# Patient Record
Sex: Male | Born: 1975 | Race: White | Hispanic: No | Marital: Married | Smoking: Never smoker
Health system: Southern US, Community
[De-identification: ages and names within clinical notes are randomized; demographics above are authoritative.]

## PROBLEM LIST (undated history)

## (undated) DIAGNOSIS — K219 Gastro-esophageal reflux disease without esophagitis: Secondary | ICD-10-CM

## (undated) DIAGNOSIS — K222 Esophageal obstruction: Secondary | ICD-10-CM

## (undated) DIAGNOSIS — E7800 Pure hypercholesterolemia, unspecified: Secondary | ICD-10-CM

## (undated) DIAGNOSIS — K449 Diaphragmatic hernia without obstruction or gangrene: Secondary | ICD-10-CM

## (undated) HISTORY — DX: Gastro-esophageal reflux disease without esophagitis: K21.9

## (undated) HISTORY — DX: Diaphragmatic hernia without obstruction or gangrene: K44.9

## (undated) HISTORY — PX: WISDOM TOOTH EXTRACTION: SHX21

## (undated) HISTORY — PX: VASECTOMY: SHX75

## (undated) HISTORY — PX: UPPER GASTROINTESTINAL ENDOSCOPY: SHX188

## (undated) HISTORY — PX: TONSILLECTOMY: SUR1361

## (undated) HISTORY — DX: Esophageal obstruction: K22.2

---

## 2001-01-07 ENCOUNTER — Emergency Department (HOSPITAL_COMMUNITY): Admission: EM | Admit: 2001-01-07 | Discharge: 2001-01-07 | Payer: Self-pay | Admitting: *Deleted

## 2001-01-07 ENCOUNTER — Encounter: Payer: Self-pay | Admitting: *Deleted

## 2001-01-13 ENCOUNTER — Emergency Department (HOSPITAL_COMMUNITY): Admission: EM | Admit: 2001-01-13 | Discharge: 2001-01-13 | Payer: Self-pay | Admitting: Emergency Medicine

## 2005-06-29 ENCOUNTER — Ambulatory Visit: Payer: Self-pay | Admitting: Internal Medicine

## 2005-06-30 ENCOUNTER — Ambulatory Visit: Payer: Self-pay | Admitting: Internal Medicine

## 2005-07-28 ENCOUNTER — Ambulatory Visit: Payer: Self-pay | Admitting: Internal Medicine

## 2006-03-24 ENCOUNTER — Ambulatory Visit: Payer: Self-pay | Admitting: Internal Medicine

## 2006-03-24 LAB — CONVERTED CEMR LAB
ALT: 40 units/L (ref 0–40)
AST: 27 units/L (ref 0–37)
Total CK: 114 units/L (ref 7–195)

## 2006-04-08 ENCOUNTER — Ambulatory Visit: Payer: Self-pay | Admitting: Internal Medicine

## 2006-04-08 LAB — CONVERTED CEMR LAB
Chol/HDL Ratio, serum: 7.7
Cholesterol: 282 mg/dL (ref 0–200)
HDL: 36.6 mg/dL — ABNORMAL LOW (ref >39.0)
LDL DIRECT: 206.6 mg/dL
Triglyceride fasting, serum: 169 mg/dL — ABNORMAL HIGH (ref 0–149)
VLDL: 34 mg/dL (ref 0–40)

## 2006-04-20 ENCOUNTER — Ambulatory Visit: Payer: Self-pay | Admitting: Internal Medicine

## 2009-02-06 ENCOUNTER — Ambulatory Visit: Payer: Self-pay | Admitting: Internal Medicine

## 2009-08-13 ENCOUNTER — Encounter: Payer: Self-pay | Admitting: Internal Medicine

## 2009-11-06 ENCOUNTER — Telehealth: Payer: Self-pay | Admitting: Gastroenterology

## 2009-11-12 ENCOUNTER — Encounter (INDEPENDENT_AMBULATORY_CARE_PROVIDER_SITE_OTHER): Payer: Self-pay | Admitting: *Deleted

## 2009-11-12 ENCOUNTER — Ambulatory Visit: Payer: Self-pay | Admitting: Gastroenterology

## 2009-11-12 DIAGNOSIS — K219 Gastro-esophageal reflux disease without esophagitis: Secondary | ICD-10-CM

## 2009-11-14 LAB — CONVERTED CEMR LAB
ALT: 26 units/L (ref 0–53)
AST: 22 units/L (ref 0–37)
Albumin: 4.4 g/dL (ref 3.5–5.2)
Alkaline Phosphatase: 43 units/L (ref 39–117)
BUN: 11 mg/dL (ref 6–23)
Basophils Absolute: 0 10*3/uL (ref 0.0–0.1)
Basophils Relative: 0.4 % (ref 0.0–3.0)
Bilirubin, Direct: 0.2 mg/dL (ref 0.0–0.3)
CO2: 27 meq/L (ref 19–32)
Calcium: 9.3 mg/dL (ref 8.4–10.5)
Chloride: 104 meq/L (ref 96–112)
Cholesterol: 254 mg/dL — ABNORMAL HIGH (ref 0–200)
Creatinine, Ser: 0.7 mg/dL (ref 0.4–1.5)
Direct LDL: 170.6 mg/dL
Eosinophils Absolute: 0.2 10*3/uL (ref 0.0–0.7)
Eosinophils Relative: 3.5 % (ref 0.0–5.0)
Ferritin: 162.8 ng/mL (ref 22.0–322.0)
Folate: 9.9 ng/mL
GFR calc non Af Amer: 137.13 mL/min (ref >60)
Glucose, Bld: 94 mg/dL (ref 70–99)
HCT: 43.1 % (ref 39.0–52.0)
HDL: 46 mg/dL (ref >39.00)
Hemoglobin: 14.9 g/dL (ref 13.0–17.0)
Iron: 153 ug/dL (ref 42–165)
Lymphocytes Relative: 36.4 % (ref 12.0–46.0)
Lymphs Abs: 2.4 10*3/uL (ref 0.7–4.0)
MCHC: 34.6 g/dL (ref 30.0–36.0)
MCV: 91.3 fL (ref 78.0–100.0)
Monocytes Absolute: 0.5 10*3/uL (ref 0.1–1.0)
Monocytes Relative: 7.9 % (ref 3.0–12.0)
Neutro Abs: 3.4 10*3/uL (ref 1.4–7.7)
Neutrophils Relative %: 51.8 % (ref 43.0–77.0)
Platelets: 235 10*3/uL (ref 150.0–400.0)
Potassium: 4.4 meq/L (ref 3.5–5.1)
RBC: 4.72 M/uL (ref 4.22–5.81)
RDW: 12.8 % (ref 11.5–14.6)
Saturation Ratios: 44.3 % (ref 20.0–50.0)
Sodium: 137 meq/L (ref 135–145)
TSH: 1.05 microintl units/mL (ref 0.35–5.50)
Total Bilirubin: 0.6 mg/dL (ref 0.3–1.2)
Total CHOL/HDL Ratio: 6
Total Protein: 7.3 g/dL (ref 6.0–8.3)
Transferrin: 246.6 mg/dL (ref 212.0–360.0)
Triglycerides: 191 mg/dL — ABNORMAL HIGH (ref 0.0–149.0)
VLDL: 38.2 mg/dL (ref 0.0–40.0)
Vitamin B-12: 339 pg/mL (ref 211–911)
WBC: 6.6 10*3/uL (ref 4.5–10.5)

## 2009-11-20 ENCOUNTER — Ambulatory Visit: Payer: Self-pay | Admitting: Gastroenterology

## 2009-11-20 LAB — CONVERTED CEMR LAB: UREASE: NEGATIVE

## 2009-11-21 ENCOUNTER — Telehealth: Payer: Self-pay | Admitting: Gastroenterology

## 2009-11-22 ENCOUNTER — Encounter: Payer: Self-pay | Admitting: Gastroenterology

## 2009-12-10 ENCOUNTER — Telehealth: Payer: Self-pay | Admitting: Gastroenterology

## 2009-12-24 ENCOUNTER — Ambulatory Visit: Payer: Self-pay | Admitting: Gastroenterology

## 2010-02-18 ENCOUNTER — Telehealth: Payer: Self-pay | Admitting: Gastroenterology

## 2010-04-09 ENCOUNTER — Ambulatory Visit: Payer: Self-pay | Admitting: Internal Medicine

## 2010-04-09 DIAGNOSIS — J019 Acute sinusitis, unspecified: Secondary | ICD-10-CM

## 2010-07-15 NOTE — Progress Notes (Signed)
Summary: triage  Phone Note Call from Patient Call back at Home Phone [phone]   Caller: Patient Call For: Dr. Jarold Motto Reason for Call: Talk to Nurse Summary of Call: offered next available- June 28th... pt states he is a personal friend of Dr. Jarold Motto and that he spoke with Dr. Jarold Motto last night about his GI concerns... pt states that Dr. Jarold Motto told him to call our office and ask for Lupita Leash so that he could be worked in asap... pt was told by dentist to see a GI because he had acid coming up into his mouth and eroding his tooth enamel...  Initial call taken by: Vallarie Mare,  Nov 06, 2009 10:24 AM  Follow-up for Phone Call        LM for pt to call.    Ashok Cordia RN  Nov 06, 2009 11:10 AM   Appt scheduled.  Pt notified of no show fee. Follow-up by: Ashok Cordia RN,  Nov 06, 2009 11:31 AM

## 2010-07-15 NOTE — Assessment & Plan Note (Signed)
Summary: Post Proc 4-6 weeks.dfs   History of Present Illness Visit Type: Follow-up Visit Primary GI MD: Sheryn Bison MD FACP FAGA Primary Provider: Marga Melnick, MD Requesting Provider: na Chief Complaint: F/u from endo. Pt states that he is great and denies any GI complaints History of Present Illness:   35 year old Caucasian male in recent had endoscopy with esophageal dilatation. He had severe esophagitis and a peptic stricture that was dilated. Surprisingly, he had lower GI symptomatology, and his dentist had noticed severe dental erosions. He currently is on daily PPI therapy and is asymptomatic. Lab data was checked and was normal except for a mildly elevated serum cholesterol level.   GI Review of Systems      Denies abdominal pain, acid reflux, belching, bloating, chest pain, dysphagia with liquids, dysphagia with solids, heartburn, loss of appetite, nausea, vomiting, vomiting blood, weight loss, and  weight gain.        Denies anal fissure, black tarry stools, change in bowel habit, constipation, diarrhea, diverticulosis, fecal incontinence, heme positive stool, hemorrhoids, irritable bowel syndrome, jaundice, light color stool, liver problems, rectal bleeding, and  rectal pain.    Current Medications (verified): 1)  Aspirin 81 Mg Tabs (Aspirin) .... Take 1 Tablet By Mouth Once A Day 2)  Fish Oil  Oil (Fish Oil) .... Take 1 Capsule By Mouth Once A Day 3)  Aciphex 20 Mg Tbec (Rabeprazole Sodium) .... One Tablet By Mouth Once Daily  Allergies (verified): No Known Drug Allergies  Past History:  Past medical, surgical, family and social histories (including risk factors) reviewed for relevance to current acute and chronic problems.  Past Medical History: Reviewed history from 11/12/2009 and no changes required. Hyperlipidemia  Past Surgical History: Reviewed history from 11/12/2009 and no changes required. Tonsillectomy  Family History: Reviewed history from  11/12/2009 and no changes required. No FH of Colon Cancer:  Social History: Reviewed history from 11/12/2009 and no changes required. Married, 1 boy, 1 girl Self Employed Patient has never smoked.  Alcohol Use - yes 3 drinks/day Illicit Drug Use - no Patient gets regular exercise. 2 x week  Review of Systems  The patient denies allergy/sinus, anemia, anxiety-new, arthritis/joint pain, back pain, blood in urine, breast changes/lumps, change in vision, confusion, cough, coughing up blood, depression-new, fainting, fatigue, fever, headaches-new, hearing problems, heart murmur, heart rhythm changes, itching, menstrual pain, muscle pains/cramps, night sweats, nosebleeds, pregnancy symptoms, shortness of breath, skin rash, sleeping problems, sore throat, swelling of feet/legs, swollen lymph glands, thirst - excessive , urination - excessive , urination changes/pain, urine leakage, vision changes, and voice change.    Vital Signs:  Patient profile:   35 year old male Height:      73 inches Weight:      230 pounds BMI:     30.45 BSA:     2.29 Pulse rate:   64 / minute Pulse rhythm:   regular BP sitting:   122 / 76  (left arm) Cuff size:   regular  Vitals Entered By: Ok Anis CMA (December 24, 2009 9:50 AM)  Physical Exam  General:  Well developed, well nourished, no acute distress.healthy appearing.   Head:  Normocephalic and atraumatic. Eyes:  PERRLA, no icterus.exam deferred to patient's ophthalmologist.   Psych:  Alert and cooperative. Normal mood and affect.   Impression & Recommendations:  Problem # 1:  ESOPHAGEAL REFLUX (ICD-530.81) Assessment Improved Chronic reflux or G. reviewed and I have advised daily PPI therapy with GI followup in 6 months time.  Biopsies of the esophagus did not show evidence of Barrett's mucosa. He did have a 5 cm hiatal hernia.  Problem # 2:  SCREENING FOR LIPOID DISORDERS (ICD-V77.91) Assessment: Unchanged Gradual weight loss reduction plan  recommended per his obesity. He may need statin therapy per Dr. Alwyn Ren.  Patient Instructions: 1)  Increase Omeprazole to 40 mg once daily. 2)  Please schedule a follow-up appointment in 6 months. 3)  The medication list was reviewed and reconciled.  All changed / newly prescribed medications were explained.  A complete medication list was provided to the patient / caregiver. 4)  Copy sent to : Dr. Marga Melnick and Dr. Johny Sax, DDS. 5)  Please continue current medications.  6)  Avoid foods high in acid content ( tomatoes, citrus juices, spicy foods) . Avoid eating within 3 to 4 hours of lying down or before exercising. Do not over eat; try smaller more frequent meals. Elevate head of bed four inches when sleeping.

## 2010-07-15 NOTE — Miscellaneous (Signed)
Summary: gi med  Clinical Lists Changes  Medications: Added new medication of ACIPHEX 20 MG  TBEC (RABEPRAZOLE SODIUM) Take 1 each am 30 minutes before breakfast - Signed Rx of ACIPHEX 20 MG  TBEC (RABEPRAZOLE SODIUM) Take 1 each am 30 minutes before breakfast;  #30 x 11;  Signed;  Entered by: Eual Fines RN;  Authorized by: Mardella Layman MD [facility];  Method used: Electronically to [id number]*, [address] [place], [place], [place]  [address], Ph: [phone], Fax: [phone] Observations: Added new observation of NKA: T (11/20/2009 14:16)    Prescriptions: ACIPHEX 20 MG  TBEC (RABEPRAZOLE SODIUM) Take 1 each am 30 minutes before breakfast  #30 x 11   Entered by:   Eual Fines RN   Authorized by:   Mardella Layman MD [facility]   Signed by:   Eual Fines RN on 11/20/2009   Method used:   Electronically to        [facility] [phone]* (retail)       [address]       [place], [place]  [address]       Ph: [phone]       Fax: [phone]   RxID:   [id number]

## 2010-07-15 NOTE — Miscellaneous (Signed)
Summary: clotest  Clinical Lists Changes  Orders: Added new Test order of TLB-H Pylori Screen Gastric Biopsy (83013-CLOTEST) - Signed 

## 2010-07-15 NOTE — Assessment & Plan Note (Signed)
Summary: cough and cold, no fever///sph   Vital Signs:  Patient profile:   35 year old male Height:      73 inches (185.42 cm) Weight:      232 pounds (105.45 kg) BMI:     30.72 O2 Sat:      96 % on Room air Temp:     98.6 degrees F (37.00 degrees C) oral Pulse rate:   82 / minute Resp:     14 per minute BP sitting:   120 / 82  (left arm) Cuff size:   regular  Vitals Entered By: Lucious Groves CMA (April 09, 2010 12:08 PM)  O2 Flow:  Room air CC: Cold and cough x1 week/Possible sinus infection./kb, URI symptoms Is Patient Diabetic? No Pain Assessment Patient in pain? no      Comments Patient notes that he has been having fever, yellow mucous production, congestion, and sore throat. Patient denies SOB./kb   Primary Care Provider:  Marga Melnick, MD  CC:  Cold and cough x1 week/Possible sinus infection./kb and URI symptoms.  History of Present Illness: RTI  Symptoms      This is a 35 year old man who presents with RTI  symptoms x 1 week, onset as ST & rhinitis.  The patient reports nasal congestion & PNDrainage, purulent nasal discharge, sore throat, and productive cough with yelow sputum, but denies earache.  Associated symptoms include low-grade fever (<100.5 degrees) and wheezing with deep breathing. No PMH of asthma The patient denies dyspnea.  The patient also reports headache with the cough and  marked  fatigue.  The patient denies itchy watery eyes and sneezing.  The patient denies the following risk factors for Strep sinusitis: unilateral facial pain and tooth pain.  Rx: Mucinex , Advil  Current Medications (verified): 1)  Aspirin 81 Mg Tabs (Aspirin) .... Take 1 Tablet By Mouth Once A Day 2)  Fish Oil  Oil (Fish Oil) .... Take 1 Capsule By Mouth Once A Day 3)  Omeprazole 40 Mg  Cpdr (Omeprazole) .... Take One By Mouth Two Times A Day  Allergies (verified): No Known Drug Allergies  Physical Exam  General:  well-nourished,in no acute distress; alert,appropriate and  cooperative throughout examination Eyes:  No corneal or conjunctival inflammation noted.  Ears:  External ear exam shows no significant lesions or deformities.  Otoscopic examination reveals  some wax bilaterally. Nose:  External nasal examination shows no deformity or inflammation. Nasal mucosa are pink and moist without lesions or exudates. Mouth:  Oral mucosa and oropharynx without lesions or exudates.  Teeth in good repair. Mild uvular  erythema Lungs:  Normal respiratory effort, chest expands symmetrically. Lungs are clear to auscultation except minor isolated inspiratory pop Heart:  Normal rate and regular rhythm. S1 and S2 normal without gallop, murmur, click, rub or other extra sounds. Cervical Nodes:  No lymphadenopathy noted Axillary Nodes:  No palpable lymphadenopathy   Impression & Recommendations:  Problem # 1:  SINUSITIS- ACUTE-NOS (ICD-461.9)  His updated medication list for this problem includes:    Amoxicillin-pot Clavulanate 875-125 Mg Tabs (Amoxicillin-pot clavulanate) .Marland Kitchen... 1 every 12 hrs with a meal    Hydromet 5-1.5 Mg/29ml Syrp (Hydrocodone-homatropine) .Marland Kitchen... 1 tsp everyy 6 hrs as needed  Problem # 2:  BRONCHITIS-ACUTE (ICD-466.0)  His updated medication list for this problem includes:    Amoxicillin-pot Clavulanate 875-125 Mg Tabs (Amoxicillin-pot clavulanate) .Marland Kitchen... 1 every 12 hrs with a meal    Advair Diskus 250-50 Mcg/dose Aepb (Fluticasone-salmeterol) .Marland KitchenMarland KitchenMarland KitchenMarland Kitchen 1  inhalation every 12 hrs; gargle & spit after use    Hydromet 5-1.5 Mg/27ml Syrp (Hydrocodone-homatropine) .Marland Kitchen... 1 tsp everyy 6 hrs as needed  Complete Medication List: 1)  Aspirin 81 Mg Tabs (Aspirin) .... Take 1 tablet by mouth once a day 2)  Fish Oil Oil (Fish oil) .... Take 1 capsule by mouth once a day 3)  Omeprazole 40 Mg Cpdr (Omeprazole) .... Take one by mouth two times a day 4)  Amoxicillin-pot Clavulanate 875-125 Mg Tabs (Amoxicillin-pot clavulanate) .Marland Kitchen.. 1 every 12 hrs with a meal 5)  Advair  Diskus 250-50 Mcg/dose Aepb (Fluticasone-salmeterol) .Marland Kitchen.. 1 inhalation every 12 hrs; gargle & spit after use 6)  Hydromet 5-1.5 Mg/11ml Syrp (Hydrocodone-homatropine) .Marland Kitchen.. 1 tsp everyy 6 hrs as needed  Patient Instructions: 1)  Neti pot once daily until sinuses are clear. 2)  Drink as much NON  dairy fluid as you can tolerate for the next few days. Prescriptions: HYDROMET 5-1.5 MG/5ML SYRP (HYDROCODONE-HOMATROPINE) 1 tsp everyy 6 hrs as needed  #120cc x 0   Entered and Authorized by:   Marga Melnick MD   Signed by:   Marga Melnick MD on 04/09/2010   Method used:   Print then Give to Patient   RxID:   [id number] ADVAIR DISKUS 250-50 MCG/DOSE AEPB (FLUTICASONE-SALMETEROL) 1 inhalation every 12 hrs; gargle & spit after use  #1 x 0   Entered and Authorized by:   Marga Melnick MD   Signed by:   Marga Melnick MD on 04/09/2010   Method used:   Samples Given   RxID:   [id number] AMOXICILLIN-POT CLAVULANATE 875-125 MG TABS (AMOXICILLIN-POT CLAVULANATE) 1 every 12 hrs with a meal  #20 x 0   Entered and Authorized by:   Marga Melnick MD   Signed by:   Marga Melnick MD on 04/09/2010   Method used:   Faxed to ...       [facility] [phone]* (retail)       [address]       [place], [place]  [address]       Ph: [phone]       Fax: [phone]   RxID:   [id number]    Orders Added: 1)  Est. Patient Level III [[address]]

## 2010-07-15 NOTE — Progress Notes (Signed)
Summary: ? re meds  Phone Note Call from Patient Call back at Home Phone [phone]   Caller: Patient Call For: Jarold Motto Reason for Call: Talk to Nurse Summary of Call: Patient wants to speak to nurse regarding his meds Initial call taken by: Tawni Levy,  December 10, 2009 9:10 AM  Follow-up for Phone Call        LM for pt to call.  Ashok Cordia RN  December 10, 2009 9:31 AM  Spoke with pt.  Nexium is going to cost over $300.  Aciphex had been denied and we changed to nexium as suggested by insurance.  Pt asks if we can check and see why this is so expensive if it is a perferred medication. Follow-up by: Ashok Cordia RN,  December 10, 2009 2:10 PM  Additional Follow-up for Phone Call Additional follow up Details #1::        Tresa Endo, can you help me by checking on this/  thanks Additional Follow-up by: Ashok Cordia RN,  December 11, 2009 11:12 AM    Additional Follow-up for Phone Call Additional follow up Details #2::    Per note on 11-21-2009 patient per [facility] can try Nexium or Generic PPI. If Nexium is too much for patient then he can try a Generic PPI.. Follow-up by: Ok Anis CMA,  December 13, 2009 1:48 PM  Additional Follow-up for Phone Call Additional follow up Details #3:: Details for Additional Follow-up Action Taken: LM for pt to call.  Lupita Leash Surface RN  December 19, 2009 10:59 AM  Talked with pt.  Pt wants to try generic med.  Will change to omeprazole.  Pt wasnt rx sent to [facility]. Additional Follow-up by: Ashok Cordia RN,  December 19, 2009 11:44 AM  New/Updated Medications: OMEPRAZOLE 20 MG CPDR (OMEPRAZOLE) 1 by mouth once daily Prescriptions: OMEPRAZOLE 20 MG CPDR (OMEPRAZOLE) 1 by mouth once daily  #90 x 3   Entered by:   Ashok Cordia RN   Authorized by:   Mardella Layman MD [facility]   Signed by:   Ashok Cordia RN on 12/19/2009   Method used:   Faxed to ...       [facility] [place] (mail-order)             , [place]         Ph: [phone]       Fax: [phone]   RxID:    [id number]

## 2010-07-15 NOTE — Progress Notes (Signed)
Summary: refill  Phone Note Call from Patient Call back at Home Phone [phone]   Caller: Patient Call For: Dr Jarold Motto Reason for Call: Refill Medication, Talk to Nurse Summary of Call: Patient needs refills for his Omeprazole called in to [facility] on [address]. Please call patient when done. (patient states that Dr Jarold Motto told him to take it twice a day) Initial call taken by: Tawni Levy,  February 18, 2010 12:09 PM  Follow-up for Phone Call        rx sent, patient aware Follow-up by: Harlow Mares CMA Duncan Dull),  February 18, 2010 12:14 PM    New/Updated Medications: OMEPRAZOLE 40 MG  CPDR (OMEPRAZOLE) take one by mouth two times a day Prescriptions: OMEPRAZOLE 40 MG  CPDR (OMEPRAZOLE) take one by mouth two times a day  #60 x 11   Entered by:   Harlow Mares CMA (AAMA)   Authorized by:   Mardella Layman MD [facility]   Signed by:   Harlow Mares CMA (AAMA) on 02/18/2010   Method used:   Electronically to        [facility] [phone]* (retail)       [address]       [place], [place]  [address]       Ph: [phone]       Fax: [phone]   RxID:   [id number]   Appended Document: refill patient wants one refill to [facility] and the others rxs to [facility].   Clinical Lists Changes  Medications: Removed medication of ACIPHEX 20 MG TBEC (RABEPRAZOLE SODIUM) one tablet by mouth once daily Rx of OMEPRAZOLE 40 MG  CPDR (OMEPRAZOLE) take one by mouth two times a day;  #180 x 3;  Signed;  Entered by: Harlow Mares CMA (AAMA);  Authorized by: Mardella Layman MD [facility];  Method used: Faxed to [facility] [place], , , [place]  , Ph: [phone], Fax: [phone]    Prescriptions: OMEPRAZOLE 40 MG  CPDR (OMEPRAZOLE) take one by mouth two times a day  #180 x 3   Entered by:   Harlow Mares CMA (AAMA)   Authorized by:   Mardella Layman MD [facility]   Signed by:   Harlow Mares CMA (AAMA) on 02/18/2010   Method used:   Faxed to ...       [facility]  [place] (mail-order)             , [place]         Ph: [phone]       Fax: [phone]   RxID:   [id number]

## 2010-07-15 NOTE — Letter (Signed)
Summary: Patient Notice-Endo Biopsy Results  [facility]  [address] [place], [place] [address]   Phone: [phone]  Fax: [phone]        November 22, 2009 MRN: [id number]    John Whitney [address] [place], [place]  [address]    Dear [name],  I am pleased to inform you that the biopsies taken during your recent endoscopic examination did not show any evidence of cancer upon pathologic examination.  Additional information/recommendations:  __No further action is needed at this time.  Please follow-up with      your primary care physician for your other healthcare needs.  __ Please call [phone] to schedule a return visit to review      your condition.  _XX_ Continue with the treatment plan as outlined on the day of your      exam.  __ You should have a repeat endoscopic examination for this problem              in _ months/years.   Please call us if you are having persistent problems or have questions about your condition that have not been fully answered at this time.  Sincerely,  Mardella Layman MD [facility]  This letter has been electronically signed by your physician.  Appended Document: Patient Notice-Endo Biopsy Results letter mailed.

## 2010-07-15 NOTE — Consult Note (Signed)
Summary: [facility]  [facility]   Imported By: Lanelle Bal 08/23/2009 08:46:45  _____________________________________________________________________  External Attachment:    Type:   Image     Comment:   External Document

## 2010-07-15 NOTE — Procedures (Signed)
Summary: Upper Endoscopy  Patient: Jamario Colina Note: All result statuses are Final unless otherwise noted.  Tests: (1) Upper Endoscopy (EGD)   EGD Upper Endoscopy       DONE     [facility]     [address]     [place], [place]  [address]           ENDOSCOPY PROCEDURE REPORT           PATIENT:  John Whitney, John Whitney  MR#:  [id number]     BIRTHDATE:  1975/07/29, 34 yrs. old  GENDER:  male           ENDOSCOPIST:  Vania Rea. Jarold Motto, MD, [facility]     Referred by:           PROCEDURE DATE:  11/20/2009     PROCEDURE:  EGD with biopsy     ASA CLASS:  Class I     INDICATIONS:  RECURRENT DENTAL EROSIONS AND OCCASIONAL GERD     SYMPTOMS,,,           MEDICATIONS:   Fentanyl 50 mcg IV, Versed 8 mg IV     TOPICAL ANESTHETIC:  Exactacain Spray           DESCRIPTION OF PROCEDURE:   After the risks benefits and     alternatives of the procedure were thoroughly explained, informed     consent was obtained.  The LB GIF-H180 [id number] endoscope was     introduced through the mouth and advanced to the second portion of     the duodenum, without limitations.  The instrument was slowly     withdrawn as the mucosa was fully examined.     <<PROCEDUREIMAGES>>           A stricture was found. CIRCUMFERENTIAL DISTAL STRICTURE, AND     SUPERFICIAL EROSIONS,AND 5 CM. HIATIAL HERNIA NOTED,,,SEE     PICTURES. BIOPSIES DONE OF STRICTURE AND DISTAL ESOPHAGUS.  The     stomach was entered and closely examined. The antrum, angularis,     and lesser curvature were well visualized, including a retroflexed     view of the cardia and fundus. The stomach wall was normally     distensable. The scope passed easily through the pylorus into the     duodenum. CLO BX. DONE.  A hiatal hernia was found. PROMINANT 5 CM     HH NOTED AND FREE REFLUX.  The duodenal bulb was normal in     appearance, as was the postbulbar duodenum.    Retroflexed views     revealed a hiatal hernia.    The scope was then withdrawn from the    patient and the procedure completed.           COMPLICATIONS:  None           ENDOSCOPIC IMPRESSION:     1) Stricture     2) Normal stomach     3) Hiatal hernia     4) Normal duodenum     5) A hiatal hernia     CHRONIC GERD AND DISTAL STRICTURE.R/O BARRETT'S MUCOSA.     RECOMMENDATIONS:     1) Anti-reflux regimen to be follow     2) Await biopsy results     DAILY PPI.ACIPHEX 20 MG QAM.#30.REFILL X 11.           REPEAT EXAM:  No           ______________________________  Vania Rea. Jarold Motto, MD, [facility]           CC:  Pecola Lawless, MD           n.     Rosalie DoctorMarland Kitchen   Vania Rea. Patterson at 11/20/2009 02:01 PM           Kelli Hope, [phone]  Note: An exclamation mark (!) indicates a result that was not dispersed into the flowsheet. Document Creation Date: 11/20/2009 2:02 PM _______________________________________________________________________  (1) Order result status: Final Collection or observation date-time: 11/20/2009 13:50 Requested date-time:  Receipt date-time:  Reported date-time:  Referring Physician:   Ordering Physician: Sheryn Bison [phone]) Specimen Source:  Source: Launa Grill Order Number: [id number] Lab site:

## 2010-07-15 NOTE — Assessment & Plan Note (Signed)
Summary: ? Gerd/ dfs   History of Present Illness Visit Type: new patient Primary GI MD: Sheryn Bison MD FACP FAGA Primary Provider: Marga Melnick, MD Chief Complaint: Reflux History of Present Illness:   35 year old Caucasian male self-referred after seeing his dentist and being diagnosed with multiple periodontal erosions from possible GERD.  John Whitney has been in excellent health all of his life without specific medical or surgical problems except for mild hyperlipidemia. He is not on antilipid drug therapy at this time, but does take aspirin 81 mg a day and daily fish oil. He Reports occasional nocturnal acid reflux after dietary indiscretions. He has had no typical burning substernal chest pain or regurgitation, coughing, hoarseness, asthma, globus sensation, or dysphagia.He Has not had previous evaluation or endoscopic exams or barium studies. His appetite is good and his weight is stable. He denies abuse of cigarettes or NSAIDs but uses alcohol socially. He gives no history of previous hepatitis or pancreatitis. He has regular bowel movements without melena or hematochezia. Family history is noncontributory.   GI Review of Systems    Reports acid reflux.      Denies abdominal pain, belching, bloating, chest pain, dysphagia with liquids, dysphagia with solids, heartburn, loss of appetite, nausea, vomiting, vomiting blood, weight loss, and  weight gain.        Denies anal fissure, black tarry stools, change in bowel habit, constipation, diarrhea, diverticulosis, fecal incontinence, heme positive stool, hemorrhoids, irritable bowel syndrome, jaundice, light color stool, liver problems, rectal bleeding, and  rectal pain. Preventive Screening-Counseling & Management  Alcohol-Tobacco     Smoking Status: never  Caffeine-Diet-Exercise     Does Patient Exercise: yes      Drug Use:  no.      Current Medications (verified): 1)  Aspirin 81 Mg Tabs (Aspirin) .... Take 1 Tablet By Mouth  Once A Day 2)  Fish Oil  Oil (Fish Oil) .... Take 1 Capsule By Mouth Once A Day  Allergies (verified): No Known Drug Allergies  Past History:  Past medical, surgical, family and social histories (including risk factors) reviewed for relevance to current acute and chronic problems.  Past Medical History: Hyperlipidemia  Past Surgical History: Tonsillectomy  Family History: Reviewed history and no changes required. No FH of Colon Cancer:  Social History: Reviewed history and no changes required. Married, 1 boy, 1 girl Self Employed Patient has never smoked.  Alcohol Use - yes 3 drinks/day Illicit Drug Use - no Patient gets regular exercise. 2 x week Smoking Status:  never Drug Use:  no Does Patient Exercise:  yes  Review of Systems  The patient denies allergy/sinus, anemia, anxiety-new, arthritis/joint pain, back pain, blood in urine, breast changes/lumps, change in vision, confusion, cough, coughing up blood, depression-new, fainting, fatigue, fever, headaches-new, hearing problems, heart murmur, heart rhythm changes, itching, menstrual pain, muscle pains/cramps, night sweats, nosebleeds, pregnancy symptoms, shortness of breath, skin rash, sleeping problems, sore throat, swelling of feet/legs, swollen lymph glands, thirst - excessive , urination - excessive , urination changes/pain, urine leakage, vision changes, and voice change.    Vital Signs:  Patient profile:   35 year old male Height:      73 inches Weight:      230 pounds BMI:     30.45 Pulse rate:   60 / minute Pulse rhythm:   regular BP sitting:   120 / 82  (left arm) Cuff size:   large  Vitals Entered By: Francee Piccolo CMA Duncan Dull) (Nov 12, 2009 10:35 AM)  Physical Exam  General:  Well developed, well nourished, no acute distress.healthy appearing.   Head:  Normocephalic and atraumatic. Eyes:  PERRLA, no icterus.exam deferred to patient's ophthalmologist.   Mouth:  No deformity or lesions, dentition  normal. Neck:  Supple; no masses or thyromegaly. Lungs:  Clear throughout to auscultation. Heart:  Regular rate and rhythm; no murmurs, rubs,  or bruits. Abdomen:  Soft, nontender and nondistended. No masses, hepatosplenomegaly or hernias noted. Normal bowel sounds. Msk:  Symmetrical with no gross deformities. Normal posture. Pulses:  Normal pulses noted. Extremities:  No clubbing, cyanosis, edema or deformities noted. Neurologic:  Alert and  oriented x4;  grossly normal neurologically. Skin:  Intact without significant lesions or rashes. Cervical Nodes:  No significant cervical adenopathy. Psych:  Alert and cooperative. Normal mood and affect.   Impression & Recommendations:  Problem # 1:  ESOPHAGEAL REFLUX (ICD-530.81) Assessment Unchanged Possible extra esophageal manifestation of chronic GERD. Because of his young  age and lack of typical symptomatology, I have elected to proceed with endoscopic exam for diagnostic purposes. He saw our patient education movie on GERD and its management. Screening laboratory tests also been ordered for review. We need to exclude Barrett's mucosa, large hiatal hernia, chronic gastritis and H. pylori infection,etc. I have elected not to treat him with any PPI medication at this time.Of Note, he is on daily aspirin therapy. TLB-CBC Platelet - w/Differential (85025-CBCD) TLB-BMP (Basic Metabolic Panel-BMET) (80048-METABOL) TLB-Hepatic/Liver Function Pnl (80076-HEPATIC) TLB-TSH (Thyroid Stimulating Hormone) (84443-TSH) TLB-B12, Serum-Total ONLY (60454-U98) TLB-Ferritin (82728-FER) TLB-Folic Acid (Folate) (82746-FOL) TLB-IBC Pnl (Iron/FE;Transferrin) (83550-IBC) TLB-Lipid Panel (80061-LIPID)  Problem # 2:  SCREENING FOR LIPOID DISORDERS (ICD-V77.91) Assessment: Unchanged Labs including lipid profile ordered and will forward this to Dr. Alwyn Ren.  Patient Instructions: 1)  Please go to the basement for lab work. 2)  You are scheduled for an upper  endoscopy. 3)  The medication list was reviewed and reconciled.  All changed / newly prescribed medications were explained.  A complete medication list was provided to the patient / caregiver. 4)  Copy sent to : Dr. Marga Melnick. 5)  Please continue current medications.  6)  Conscious Sedation brochure given.  7)  Upper Endoscopy brochure given.   Appended Document: ? Gerd/ dfs    Clinical Lists Changes  Orders: Added new Test order of EGD (EGD) - Signed

## 2010-07-15 NOTE — Progress Notes (Signed)
  Phone Note Outgoing Call   Call placed by: Ok Anis CMA,  November 21, 2009 4:33 PM Call placed to: Insurer Summary of Call: [employer] for prior authorization for Aciphex and [employer] sent prior authorization  form for me to fill out and fax back..Faxed back    Follow-up for Phone Call        [employer] faxed letter back stating that patient will need to try Nexium or Generic PPI.   Will send to Deeann Cree RN to see what PPI patient can take  Follow-up by: Ok Anis CMA,  November 25, 2009 4:37 PM  Additional Follow-up for Phone Call Additional follow up Details #1::        Nexium 40 mg daily.  Additional Follow-up by: Ashok Cordia RN,  November 26, 2009 12:31 PM    Additional Follow-up for Phone Call Additional follow up Details #2::    [employer] came back and approved Aciphex. So Nexium Rx will not be sent to pharmacy    Appended Document:  Was looking at wrong patient will send Nexium Rx today.........Marland Kitchen  Appended Document:     Clinical Lists Changes  Medications: Changed medication from ACIPHEX 20 MG  TBEC (RABEPRAZOLE SODIUM) Take 1 each am 30 minutes before breakfast to NEXIUM 40 MG CPDR (ESOMEPRAZOLE MAGNESIUM) one tablet by mouth once daily - Signed Rx of NEXIUM 40 MG CPDR (ESOMEPRAZOLE MAGNESIUM) one tablet by mouth once daily;  #90 x 1;  Signed;  Entered by: Ok Anis CMA;  Authorized by: Mardella Layman MD [facility];  Method used: Faxed to [facility] [place], , , [place]  , Ph: [phone], Fax: [phone]    Prescriptions: NEXIUM 40 MG CPDR (ESOMEPRAZOLE MAGNESIUM) one tablet by mouth once daily  #90 x 1   Entered by:   Ok Anis CMA   Authorized by:   Mardella Layman MD [facility]   Signed by:   Ok Anis CMA on 11/26/2009   Method used:   Faxed to ...       [facility] [place] (mail-order)             , [place]         Ph: [phone]       Fax: [phone]   RxID:   [id number]

## 2010-07-15 NOTE — Letter (Signed)
Summary: EGD Instructions  [facility]  [address] [place], [place] [address]   Phone: [phone]  Fax: [phone]       John Whitney    1975/06/29    MRN: [id number]       Procedure Day Dorna Bloom:  Wednesday, 11/20/09     Arrival Time:  12:30     Procedure Time: 1:30     Location of Procedure:                    _X  _ [facility] (4th Floor)    PREPARATION FOR ENDOSCOPY   On 11/20/09 THE DAY OF THE PROCEDURE:  1.   No solid foods, milk or milk products are allowed after midnight the night before your procedure.  2.   Do not drink anything colored red or purple.  Avoid juices with pulp.  No orange juice.  3.  You may drink clear liquids until 11:30, which is 2 hours before your procedure.                                                                                                CLEAR LIQUIDS INCLUDE: Water Jello Ice Popsicles Tea (sugar ok, no milk/cream) Powdered fruit flavored drinks Coffee (sugar ok, no milk/cream) Gatorade Juice: apple, white grape, white cranberry  Lemonade Clear bullion, consomm, broth Carbonated beverages (any kind) Strained chicken noodle soup Hard Candy   MEDICATION INSTRUCTIONS  Unless otherwise instructed, you should take regular prescription medications with a small sip of water as early as possible the morning of your procedure.    .                    OTHER INSTRUCTIONS  You will need a responsible adult at least 35 years of age to accompany you and drive you home.   This person must remain in the waiting room during your procedure.  Wear loose fitting clothing that is easily removed.  Leave jewelry and other valuables at home.  However, you may wish to bring a book to read or an iPod/MP3 player to listen to music as you wait for your procedure to start.  Remove all body piercing jewelry and leave at home.  Total time from sign-in until discharge is approximately 2-3 hours.  You  should go home directly after your procedure and rest.  You can resume normal activities the day after your procedure.  The day of your procedure you should not:   Drive   Make legal decisions   Operate machinery   Drink alcohol   Return to work  You will receive specific instructions about eating, activities and medications before you leave.    The above instructions have been reviewed and explained to me by   _______________________    I fully understand and can verbalize these instructions _____________________________ Date _________

## 2011-03-11 ENCOUNTER — Other Ambulatory Visit: Payer: Self-pay | Admitting: Gastroenterology

## 2011-11-04 ENCOUNTER — Emergency Department (HOSPITAL_COMMUNITY)
Admission: EM | Admit: 2011-11-04 | Discharge: 2011-11-04 | Disposition: A | Discharge status: Home / Self Care | Payer: BC Managed Care – PPO | Source: Home / Self Care | Attending: Emergency Medicine | Admitting: Emergency Medicine

## 2011-11-04 ENCOUNTER — Encounter (HOSPITAL_COMMUNITY): Payer: Self-pay | Admitting: *Deleted

## 2011-11-04 DIAGNOSIS — J019 Acute sinusitis, unspecified: Secondary | ICD-10-CM

## 2011-11-04 HISTORY — DX: Pure hypercholesterolemia, unspecified: E78.00

## 2011-11-04 LAB — POCT RAPID STREP A: Streptococcus, Group A Screen (Direct): NEGATIVE

## 2011-11-04 MED ORDER — AMOXICILLIN-POT CLAVULANATE 875-125 MG PO TABS: 1.0000 | ORAL_TABLET | Freq: Two times a day (BID) | ORAL | Status: AC

## 2011-11-04 NOTE — Discharge Instructions (Signed)

## 2011-11-04 NOTE — ED Provider Notes (Signed)
Chief Complaint  Patient presents with  . Sore Throat    History of Present Illness:   The patient is a 36 year old male who has had a five-day history of sore throat, pain with swallowing, nasal congestion with yellow drainage, red eyes with white discharge, and cough productive yellow sputum. He denies any fever, chills, headache, purulent drainage from his eyes, chest congestion, nausea, vomiting, or diarrhea. He's not been exposed to strep.  Review of Systems:  Other than noted above, the patient denies any of the following symptoms. Systemic:  No fever, chills, sweats, fatigue, myalgias, headache, or anorexia. Eye:  No redness, pain or drainage. ENT:  No earache, ear congestion, nasal congestion, sneezing, rhinorrhea, sinus pressure, sinus pain, post nasal drip, or sore throat. Lungs:  No cough, sputum production, wheezing, shortness of breath, or chest pain. GI:  No abdominal pain, nausea, vomiting, or diarrhea. Skin:  No rash or itching.  PMFSH:  Past medical history, family history, social history, meds, and allergies were reviewed.  Physical Exam:   Vital signs:  BP 128/84  Pulse 72  Temp 98.6 F (37 C)  Resp 16  SpO2 99% General:  Alert, in no distress. Eye:  No conjunctival injection or drainage. Lids were normal. ENT:  TMs and canals were normal, without erythema or inflammation.  Nasal mucosa was congested with yellow drainage.  Mucous membranes were moist.  Pharynx was erythematous and swollen with yellowish postnasal drainage.  There were no oral ulcerations or lesions. Neck:  Supple, no adenopathy, tenderness or mass. Lungs:  No respiratory distress.  Lungs were clear to auscultation, without wheezes, rales or rhonchi.  Breath sounds were clear and equal bilaterally. Lungs were resonant to percussion.  No egophony. Heart:  Regular rhythm, without gallops, murmers or rubs. Skin:  Clear, warm, and dry, without rash or lesions.  Labs:   Results for orders placed during  the hospital encounter of 11/04/11  POCT RAPID STREP A ([facility] URG CARE ONLY)      Component Value Range   Streptococcus, Group A Screen (Direct) NEGATIVE  NEGATIVE    Assessment:  The encounter diagnosis was Acute sinusitis.  Plan:   1.  The following meds were prescribed:   New Prescriptions   AMOXICILLIN-CLAVULANATE (AUGMENTIN) 875-125 MG PER TABLET    Take 1 tablet by mouth 2 (two) times daily.   2.  The patient was instructed in symptomatic care and handouts were given. 3.  The patient was told to return if becoming worse in any way, if no better in 3 or 4 days, and given some red flag symptoms that would indicate earlier return.   Reuben Likes, MD 11/04/11 1750

## 2011-11-04 NOTE — ED Notes (Signed)
pT  REPORTSB SORETHROAT     WITH  PAINFULL  SWALLOWING   X  2-3  DAYS  -  HE   REPORTS  HE  HAS  HISTORY  OF  ALLERGIES       AND  ACTUALLY  TOOK SOME  PREDNISONE  HE  HAD  ON HAND       - HE  IS  ALERT AND  ORIENTED   SPEAKING IN  COMPLETE  SENTANCES    IN NO ACUTE  DISTRESS

## 2012-04-01 ENCOUNTER — Other Ambulatory Visit: Payer: Self-pay | Admitting: Gastroenterology

## 2012-04-27 ENCOUNTER — Encounter: Payer: Self-pay | Admitting: Internal Medicine

## 2012-04-27 ENCOUNTER — Ambulatory Visit (INDEPENDENT_AMBULATORY_CARE_PROVIDER_SITE_OTHER): Payer: BC Managed Care – PPO | Admitting: Internal Medicine

## 2012-04-27 VITALS — BP 120/70 | HR 81 | Temp 98.3°F | Wt 239.0 lb

## 2012-04-27 DIAGNOSIS — Z23 Encounter for immunization: Secondary | ICD-10-CM

## 2012-04-27 DIAGNOSIS — L0291 Cutaneous abscess, unspecified: Secondary | ICD-10-CM

## 2012-04-27 DIAGNOSIS — L0390 Cellulitis, unspecified: Secondary | ICD-10-CM

## 2012-04-27 MED ORDER — MUPIROCIN 2 % EX OINT: TOPICAL_OINTMENT | CUTANEOUS | Status: DC

## 2012-04-27 NOTE — Patient Instructions (Addendum)
Plain Mucinex for thick secretions ;force NON dairy fluids . Use a Neti pot daily as needed for sinus congestion; going from open side to congested side . Nasal cleansing in the shower as discussed. Make sure that all residual soap is removed to prevent irritation.  Report nasal purulence; facial pain;  fever; frontal headache.

## 2012-04-27 NOTE — Progress Notes (Signed)
  Subjective:    Patient ID: John Whitney, male    DOB: Jun 03, 1976, 36 y.o.   MRN: [id number]  HPI  This morning @ approximately 11 AM he noticed some tenderness in the right axilla. His wife thought there might be some slight redness in this area.  There has been no fever, chills, sweats, or change in weight. He also denies any abnormal bruising or bleeding or lymphadenopathy.  He had a similar issue sometime within the past 12 months which resolved spontaneously without treatment.  No PMH of MRSA.     Review of Systems He had been on a fishing trip within the last 8 days and has developed some head congestion &  Cough with clear sputum. He denies frontal headache, facial pain, nasal purulence, or purulent sputum.     Objective:   Physical Exam General appearance:good health ;well nourished; no acute distress or increased work of breathing is present.  No  lymphadenopathy about the head, neck, or axilla noted.   Eyes: No conjunctival inflammation or lid edema is present. .  Ears:  External ear exam shows no significant lesions or deformities.  Otoscopic examination reveals wax on R; L  tympanic membrane are intact  without bulging, retraction, inflammation or discharge.  Nose:  External nasal examination shows no deformity or inflammation. Nasal mucosa are pink and moist without lesions or exudates. Slight R septal deviation.No obstruction to airflow.   Oral exam: Dental hygiene is good; lips and gums are healthy appearing.There is no oropharyngeal erythema or exudate noted.   Neck:  No deformities,  masses, or tenderness noted.     Heart:  Normal rate and regular rhythm. S1 and S2 normal without gallop, murmur, click, rub or other extra sounds.   Lungs:Chest clear to auscultation; no wheezes, rhonchi,rales ,or rubs present.No increased work of breathing.    Bowel sounds are normal. Abdomen is soft and nontender with no organomegaly, hernias  or masses.   Extremities:  No  cyanosis, edema, or clubbing  noted    Skin: Warm & dry . Faint erythema 15 x 30 mm with slight tenderness to palpation right anterior axillary line.          Assessment & Plan:  #1 cellulitis right axillary area  #2 upper respiratory tract symptoms without rhinosinusitis  Plan: See orders and recommendations

## 2012-10-20 ENCOUNTER — Other Ambulatory Visit: Payer: Self-pay | Admitting: Gastroenterology

## 2012-10-28 ENCOUNTER — Other Ambulatory Visit: Payer: Self-pay | Admitting: Gastroenterology

## 2012-10-31 ENCOUNTER — Encounter: Payer: Self-pay | Admitting: *Deleted

## 2012-11-01 ENCOUNTER — Encounter: Payer: Self-pay | Admitting: Gastroenterology

## 2012-11-01 ENCOUNTER — Ambulatory Visit (INDEPENDENT_AMBULATORY_CARE_PROVIDER_SITE_OTHER): Payer: BC Managed Care – PPO | Admitting: Gastroenterology

## 2012-11-01 ENCOUNTER — Other Ambulatory Visit (INDEPENDENT_AMBULATORY_CARE_PROVIDER_SITE_OTHER): Payer: BC Managed Care – PPO

## 2012-11-01 VITALS — BP 126/74 | HR 68 | Ht 73.0 in | Wt 246.0 lb

## 2012-11-01 DIAGNOSIS — K219 Gastro-esophageal reflux disease without esophagitis: Secondary | ICD-10-CM

## 2012-11-01 LAB — CBC WITH DIFFERENTIAL/PLATELET
Basophils Relative: 0.4 % (ref 0.0–3.0)
Eosinophils Relative: 4.3 % (ref 0.0–5.0)
HCT: 44.3 % (ref 39.0–52.0)
Lymphs Abs: 3 10*3/uL (ref 0.7–4.0)
MCV: 89.7 fl (ref 78.0–100.0)
Monocytes Relative: 7.7 % (ref 3.0–12.0)
Neutrophils Relative %: 50.8 % (ref 43.0–77.0)
Platelets: 224 10*3/uL (ref 150.0–400.0)
RBC: 4.94 Mil/uL (ref 4.22–5.81)
WBC: 8.2 10*3/uL (ref 4.5–10.5)

## 2012-11-01 LAB — COMPREHENSIVE METABOLIC PANEL
Albumin: 4.1 g/dL (ref 3.5–5.2)
BUN: 15 mg/dL (ref 6–23)
CO2: 27 mEq/L (ref 19–32)
GFR: 81.74 mL/min (ref >60.00)
Glucose, Bld: 99 mg/dL (ref 70–99)
Potassium: 4 mEq/L (ref 3.5–5.1)
Sodium: 137 mEq/L (ref 135–145)
Total Protein: 7.3 g/dL (ref 6.0–8.3)

## 2012-11-01 LAB — HEPATIC FUNCTION PANEL
AST: 20 U/L (ref 0–37)
Albumin: 4.1 g/dL (ref 3.5–5.2)
Total Bilirubin: 0.5 mg/dL (ref 0.3–1.2)

## 2012-11-01 MED ORDER — OMEPRAZOLE 40 MG PO CPDR: 40.0000 mg | DELAYED_RELEASE_CAPSULE | Freq: Two times a day (BID) | ORAL | Status: AC

## 2012-11-01 NOTE — Patient Instructions (Signed)
Your physician has requested that you go to the basement for lab work before leaving today.  Refill of your Omeprazole was sent to your pharmacy.   Please follow up in one year.

## 2012-11-01 NOTE — Progress Notes (Signed)
This is a 37 year old Caucasian male with chronic GERD.  Previous endoscopy in June of 2011 showed a distal esophageal stricture, 5 cm hiatal hernia, erosive esophagitis.  Biopsies did not revea Barrett's mucosa.  He is been on Prilosec 40 mg a day and is completely asymptomatic.  He denies chest pain, dysphagia, or other gastrointestinal or general medical problems.  He does not smoke or abuse alcohol or NSAIDs.  His appetite is good and his weight is been stable.  He is followed by Dr. Marga Melnick, and has had mild hypercholesterolemia, but no history of cardiovascular or pulmonary difficulties.  Current Medications, Allergies, Past Medical History, Past Surgical History, Family History and Social History were reviewed in [employer] record.  ROS: All systems were reviewed and are negative unless otherwise stated in the HPI.          Physical Exam: LT appearing patient in no distress.  Blood pressure 126/74, pulse 68 and regular and weight 246 with a BMI of 32.46.  I cannot appreciate stigmata of chronic liver disease.  Chest is clear and he is in a regular rhythm without murmurs gallops or rubs.  His abdomen shows no organomegaly, masses or tenderness.  Bowel sounds are normal.  Rectal extremities are unremarkable and mental status is normal.    Assessment and Plan: Chronic GERD doing well on daily PPI therapy.  I've renewed his omeprazole have reviewed in antireflux regime with the patient.  CBC and metabolic profile ordered for review.  Do not think he needs repeat endoscopic exam at this time.  His copy this note to Dr. Marga Melnick his primary care physician. No diagnosis found.

## 2012-11-02 ENCOUNTER — Ambulatory Visit: Payer: BC Managed Care – PPO

## 2012-11-02 DIAGNOSIS — E785 Hyperlipidemia, unspecified: Secondary | ICD-10-CM

## 2012-11-02 LAB — LIPID PANEL
Cholesterol: 246 mg/dL — ABNORMAL HIGH (ref 0–200)
HDL: 36.3 mg/dL — ABNORMAL LOW (ref >39.00)
Total CHOL/HDL Ratio: 7
Triglycerides: 333 mg/dL — ABNORMAL HIGH (ref 0.0–149.0)
VLDL: 66.6 mg/dL — ABNORMAL HIGH (ref 0.0–40.0)

## 2012-11-02 LAB — FERRITIN: Ferritin: 151.7 ng/mL (ref 22.0–322.0)

## 2012-11-02 LAB — VITAMIN B12: Vitamin B-12: 373 pg/mL (ref 211–911)

## 2012-11-08 ENCOUNTER — Telehealth: Payer: Self-pay | Admitting: Internal Medicine

## 2012-11-08 DIAGNOSIS — E785 Hyperlipidemia, unspecified: Secondary | ICD-10-CM

## 2012-11-08 NOTE — Telephone Encounter (Signed)
Patient states he needs to have lipid panel repeated bc he was not fasting. Pt would like to have labs done at [facility].

## 2012-11-08 NOTE — Telephone Encounter (Signed)
Hopp please advise, Lipids were Checked at Dr.Patterson's office 11/02/2012, patient states he was not fasting. LDL was 160, Dr.Patterson recommended patient be referred to Lipid Clinic

## 2012-11-08 NOTE — Telephone Encounter (Signed)
Please see recommendations concerning nutrition and exercise.  FASTING NMR LipoProfile should be drawn after 12-14 weeks of these changes. Code: 272.4

## 2012-11-09 NOTE — Telephone Encounter (Signed)
Left message on voicemail informing patient of Dr.Hopper's response, patient to call and schedule appointment for labs in 12-14 weeks. (Future order placed )

## 2013-10-23 ENCOUNTER — Encounter: Payer: Self-pay | Admitting: Internal Medicine

## 2013-10-23 ENCOUNTER — Ambulatory Visit (INDEPENDENT_AMBULATORY_CARE_PROVIDER_SITE_OTHER): Payer: BC Managed Care – PPO | Admitting: Internal Medicine

## 2013-10-23 VITALS — BP 110/78 | HR 76 | Temp 99.0°F | Wt 239.8 lb

## 2013-10-23 DIAGNOSIS — E782 Mixed hyperlipidemia: Secondary | ICD-10-CM | POA: Insufficient documentation

## 2013-10-23 NOTE — Progress Notes (Signed)
   Subjective:    Patient ID: John Whitney, male    DOB: 02-Mar-1976, 38 y.o.   MRN: [id number]  HPI   He recently had an insurance exam and is awaiting or determination of coverage.  In January he quit drinking; he has not been exercising on a regular basis.  Insurance exam labs were performed 08/29/13. His triglycerides have dropped from 333 down to a value of 225. His LDL has risen to a value of 191. Liver function tests were normal except GGT 109  He is having no symptoms in relationship to his GERD. He does take Prilosec once daily.    Review of Systems  He denies unexplained weight loss, abdominal pain, significant dyspepsia, dysphagia, melena, rectal bleeding, or persistently small caliber stools.     Objective:   Physical Exam Appears healthy and well-nourished & in no acute distress  No carotid bruits are present.No neck pain distention present at 10 - 15 degrees. Thyroid normal to palpation  Heart rhythm and rate are normal with no gallop or murmur  Chest is clear with no increased work of breathing  There is no evidence of aortic aneurysm or renal artery bruits  Abdomen soft with no organomegaly or masses. No HJR  No clubbing, cyanosis or edema present.  Pedal pulses are intact   No ischemic skin changes are present . Fingernails healthy   Alert and oriented. Strength, tone, DTRs reflexes normal          Assessment & Plan:  #1 mixed dyslipidemia; the pathophysiology of elevated Triglycerides from high fructose corn syrup ingestion was discussed  #2 GERD, quiescent  Plan: Therapeutic lifestyle interventions followed by NMR LipoProfile in 3-4 months.

## 2013-10-23 NOTE — Progress Notes (Signed)
Pre visit review using our clinic review tool, if applicable. No additional management support is needed unless otherwise documented below in the visit note. 

## 2013-10-23 NOTE — Patient Instructions (Signed)
The most common cause of elevated triglycerides (TG) is the ingestion of sugar from high fructose corn syrup sources added to processed foods & drinks.  Eat a low-fat diet with lots of fruits and vegetables, up to 7-9 servings per day. Consume less than 40   Grams (preferably ZERO) of sugar per day from foods & drinks with High Fructose Corn Syrup (HFCS) sugar as #1,2,3 or # 4 on label.Whole Foods, [employer] & [employer] do not carry products with HFCS. Cardiovascular exercise, this can be as simple a program as walking, is recommended 30-45 minutes 3-4 times per week. If you're not exercising you should take 6-8 weeks to build up to this level.  

## 2013-11-25 ENCOUNTER — Other Ambulatory Visit: Payer: Self-pay | Admitting: Gastroenterology

## 2015-02-05 ENCOUNTER — Ambulatory Visit (INDEPENDENT_AMBULATORY_CARE_PROVIDER_SITE_OTHER): Payer: Self-pay | Admitting: Physician Assistant

## 2015-02-05 VITALS — BP 118/62 | HR 93 | Temp 99.1°F | Resp 16 | Ht 73.0 in | Wt 234.0 lb

## 2015-02-05 DIAGNOSIS — Z23 Encounter for immunization: Secondary | ICD-10-CM

## 2015-02-05 DIAGNOSIS — IMO0002 Reserved for concepts with insufficient information to code with codable children: Secondary | ICD-10-CM

## 2015-02-05 DIAGNOSIS — S81811A Laceration without foreign body, right lower leg, initial encounter: Secondary | ICD-10-CM

## 2015-02-05 NOTE — Progress Notes (Signed)
[facility] [address] [place] [address] [phone]  Date:  02/05/2015   Name:  John Whitney   DOB:  October 05, 1975   MRN:  [id number]  PCP:  Marga Melnick, MD    Chief Complaint: Leg Injury   History of Present Illness:  This is a 39 y.o. male with PMH HLD and GERD who is presenting with a laceration to his right shin that occurred 10 hours ago. He was doing yard work when his pruning shears cut his leg. He had to go to work so he cleaned the wound and stuck a bandage over the wound. He denies weakness, fever, chills, paresthesias, inability to bear weight. He cannot recall his last tdap.  Review of Systems:  Review of Systems See HPI  Patient Active Problem List   Diagnosis Date Noted  . Mixed hyperlipidemia 10/23/2013  . ESOPHAGEAL REFLUX 11/12/2009    Prior to Admission medications   Medication Sig Start Date End Date Taking? Authorizing Provider  aspirin 81 MG tablet Take 81 mg by mouth daily.   Yes Historical Provider, MD  omeprazole (PRILOSEC) 40 MG capsule Take 1 capsule (40 mg total) by mouth 2 (two) times daily. 11/01/12  Yes Mardella Layman, MD    No Known Allergies  Past Surgical History  Procedure Laterality Date  . Tonsillectomy      Social History  Substance Use Topics  . Smoking status: Never Smoker   . Smokeless tobacco: Never Used  . Alcohol Use: Yes     Comment: occasonally    Family History  Problem Relation Age of Onset  . Heart attack Paternal Grandfather   . Emphysema Maternal Grandfather     Medication list has been reviewed and updated.  Physical Examination:  Physical Exam  Constitutional: He is oriented to person, place, and time. He appears well-developed and well-nourished. No distress.  HENT:  Head: Normocephalic and atraumatic.  Right Ear: Hearing normal.  Left Ear: Hearing normal.  Nose: Nose normal.  Eyes: Conjunctivae and lids are normal. Right eye exhibits no discharge. Left eye  exhibits no discharge. No scleral icterus.  Cardiovascular: Normal rate, regular rhythm and normal pulses.   Pulmonary/Chest: Effort normal. No respiratory distress.  Musculoskeletal: Normal range of motion.  Neurological: He is alert and oriented to person, place, and time. No sensory deficit. Gait normal.  Skin: Skin is warm and dry.  1.5 cm laceration on left shin. No erythema or significant pain. No evidence of infection.  Psychiatric: He has a normal mood and affect. His speech is normal and behavior is normal. Thought content normal.   BP 118/62 mmHg  Pulse 93  Temp(Src) 99.1 F (37.3 C) (Oral)  Resp 16  Ht  (1.854 m)  Wt 234 lb (106.142 kg)  BMI 30.88 kg/m2  SpO2 98%  Procedure: Verbal consent obtained. Skin was anesthetized with 2 cc 1% lido with eip and cleaned thoroughly with soap and water. Wound superficial, no deeper structures involved. A 1.5 cm laceration located on left shin was sutured with #4 simple 4.0 ethilon sutures. Wound was dressed and wound care discussed.   Assessment and Plan:  1. Laceration 2. Need for Tdap vaccination Wound thoroughly cleaned and repaired. Wound dressed and wound care discussed. No abx needed as only 10 hours old and wound very clean. Return in 10 days for suture removal.  - Tdap vaccine greater than or equal to 7yo IM   Roswell Miners. Dyke Brackett, MHS  [facility]  02/05/2015

## 2015-02-05 NOTE — Patient Instructions (Signed)
WOUND CARE Please return in 10 days to have your stitches/staples removed or sooner if you have concerns. . Keep area clean and dry with dressing in place for 24 hours. . After 24 hours, remove bandage and wash wound gently with mild soap and warm water. When skin is dry, reapply a new bandage. Once wound is no longer draining, may leave wound open to air. . Continue daily cleansing with soap and water until stitches/staples are removed. . Do not apply any ointments or creams to the wound while stitches/staples are in place, as this may cause delayed healing. . Notify the office if you experience any of the following signs of infection: Swelling, redness, pus drainage, streaking, fever >101.0 F . Notify the office if you experience excessive bleeding that does not stop after 15-20 minutes of constant, firm pressure.   

## 2015-02-15 ENCOUNTER — Ambulatory Visit (INDEPENDENT_AMBULATORY_CARE_PROVIDER_SITE_OTHER): Payer: Self-pay | Admitting: Urgent Care

## 2015-02-15 VITALS — BP 122/72 | HR 81 | Temp 98.6°F | Resp 17 | Ht 73.5 in | Wt 238.0 lb

## 2015-02-15 DIAGNOSIS — Z4802 Encounter for removal of sutures: Secondary | ICD-10-CM

## 2015-02-15 DIAGNOSIS — IMO0002 Reserved for concepts with insufficient information to code with codable children: Secondary | ICD-10-CM

## 2015-02-15 NOTE — Progress Notes (Signed)
   Patient: John Whitney [id number]  Subjective: Josedejesus is returning for suture removal. Patient was initially seen on 02/05/2015 and had 4 simple interrupted sutures placed. Denies fever, drainage of pus or blood, wound dehiscence, edema, pain.   Objective: Physical Exam  Constitutional: He is oriented to person, place, and time and well-developed, well-nourished, and in no distress.  Cardiovascular: Normal rate.   Pulmonary/Chest: Effort normal.  Neurological: He is alert and oriented to person, place, and time.  Skin: Skin is warm and dry. No rash noted. No erythema. No pallor.      # sutures removed without incident. 1/2 inch Steri-Strip placed. Patient tolerated this well.  Assessment and Plan: Well-healed wound. Anticipatory guidance provided. Return to clinic as needed.  Wallis Bamberg, PA-C [facility] [phone] 02/15/2015  4:30 PM

## 2015-04-07 NOTE — Progress Notes (Signed)
  Medical screening examination/treatment/procedure(s) were performed by non-physician practitioner and as supervising physician I was immediately available for consultation/collaboration.     

## 2015-05-28 ENCOUNTER — Telehealth: Payer: Self-pay

## 2015-05-28 NOTE — Telephone Encounter (Signed)
Left Voice Mail for pt to call back.   RE: Flu Vaccine for 2016  

## 2017-04-08 ENCOUNTER — Ambulatory Visit (INDEPENDENT_AMBULATORY_CARE_PROVIDER_SITE_OTHER): Payer: Self-pay | Admitting: Neurology

## 2017-04-08 ENCOUNTER — Encounter: Payer: Self-pay | Admitting: Neurology

## 2017-04-08 VITALS — BP 138/86 | HR 72 | Ht 73.0 in | Wt 256.0 lb

## 2017-04-08 DIAGNOSIS — R0681 Apnea, not elsewhere classified: Secondary | ICD-10-CM

## 2017-04-08 DIAGNOSIS — R0683 Snoring: Secondary | ICD-10-CM

## 2017-04-08 DIAGNOSIS — E669 Obesity, unspecified: Secondary | ICD-10-CM

## 2017-04-08 DIAGNOSIS — G4719 Other hypersomnia: Secondary | ICD-10-CM

## 2017-04-08 NOTE — Progress Notes (Signed)
Subjective:    Patient ID: John Whitney is a 41 y.o. male.  HPI     John Foley, MD, PhD [facility] [address] [address] [place], [place] [address]  Dear Dr. Timothy Whitney,   I saw your patient, John Whitney, upon your kind request in my neurologic clinic today for initial consultation of his sleep disorder, in particular, concern for underlying obstructive sleep apnea. The patient is unaccompanied today. As you know, John Whitney is a 41 year old right-handed gentleman with an underlying medical history of hyperlipidemia, reflux disease, and obesity, who reports snoring and excessive daytime somnolence. I reviewed your office note from 03/08/2017, which you kindly included. He has woken up with a sense of gasping for air. His wife has noted pauses in his breathing when he sleeps. She denies morning headaches or nighttime nocturia. He does not always wake up rested. He typically does not nap. His Epworth sleepiness score is 6 out of 24, fatigue score is 32 out of 63. He lives at home with his wife and 2 children. He is self-employed, typically works from home but work involves frequent communication and travels to [place]. His bedtime is often late because of the time difference between hearing [place]. Bedtime is typically at midnight, wakeup time around 8 or 9. He denies telltale symptoms of restless leg syndrome or leg movements at night. He had a tonsillectomy as a child. His weight has increased gradually. He is a nonsmoker and drinks alcohol typically on weekends in the form of beer. He drinks caffeine in the form of sweet tea about 4 times a week, typically no day-to-day coffee or soda.  His Past Medical History Is Significant For: Past Medical History:  Diagnosis Date  . GERD (gastroesophageal reflux disease)   . Hiatal hernia   . Hypercholesteremia   . Stricture esophagus     His Past Surgical History Is Significant For: Past Surgical History:   Procedure Laterality Date  . TONSILLECTOMY    . VASECTOMY    . WISDOM TOOTH EXTRACTION      His Family History Is Significant For: Family History  Problem Relation Age of Onset  . Heart attack Paternal Grandfather   . Emphysema Maternal Grandfather   . Stroke Mother   . High Cholesterol Father     His Social History Is Significant For: Social History   Social History  . Marital status: Married    Spouse name: N/A  . Number of children: N/A  . Years of education: N/A   Social History Main Topics  . Smoking status: Never Smoker  . Smokeless tobacco: Never Used  . Alcohol use Yes     Comment: occasonally  . Drug use: No  . Sexual activity: Not Asked   Other Topics Concern  . None   Social History Narrative  . None    His Allergies Are:  No Known Allergies:   His Current Medications Are:  Outpatient Encounter Prescriptions as of 04/08/2017  Medication Sig  . aspirin 81 MG tablet Take 81 mg by mouth daily.  . ATORVASTATIN CALCIUM PO Take by mouth.  Marland Kitchen omeprazole (PRILOSEC) 40 MG capsule Take 1 capsule (40 mg total) by mouth 2 (two) times daily.   No facility-administered encounter medications on file as of 04/08/2017.   :  Review of Systems:  Out of a complete 14 point review of systems, all are reviewed and negative with the exception of these symptoms as listed below: Review of Systems  Neurological:       Pt presents today to discuss his sleep. Pt does endorse occasional snoring but has never had a sleep study.  Epworth Sleepiness Scale 0= would never doze 1= slight chance of dozing 2= moderate chance of dozing 3= high chance of dozing  Sitting and reading: 1 Watching TV: 1 Sitting inactive in a public place (ex. Theater or meeting): 1 As a passenger in a car for an hour without a break: 1 Lying down to rest in the afternoon: 2 Sitting and talking to someone: 0 Sitting quietly after lunch (no alcohol): 0 In a car, while stopped in traffic:  0 Total: 6      Objective:  Neurological Exam  Physical Exam Physical Examination:   Vitals:   04/08/17 1058  BP: 138/86  Pulse: 72   General Examination: The patient is a very pleasant 41 y.o. male in no acute distress. He appears well-developed and well-nourished and well groomed.   HEENT: Normocephalic, atraumatic, pupils are equal, round and reactive to light and accommodation. Extraocular tracking is good without limitation to gaze excursion or nystagmus noted. Normal smooth pursuit is noted. Hearing is grossly intact. Face is symmetric with normal facial animation and normal facial sensation. Speech is clear with no dysarthria noted. There is no hypophonia. There is no lip, neck/head, jaw or voice tremor. Neck is supple with full range of passive and active motion. There are no carotid bruits on auscultation. Oropharynx exam reveals: mild mouth dryness, good dental hygiene and mild airway crowding, due to larger uvula, thicker tongue. Mallampati is class I. Tongue protrudes centrally and palate elevates symmetrically. Tonsils are absent. Neck size is 17 7/8 inches. He has an absent overbite.   Chest: Clear to auscultation without wheezing, rhonchi or crackles noted.  Heart: S1+S2+0, regular and normal without murmurs, rubs or gallops noted.   Abdomen: Soft, non-tender and non-distended with normal bowel sounds appreciated on auscultation.  Extremities: There is no pitting edema in the distal lower extremities bilaterally. Pedal pulses are intact.  Skin: Warm and dry without trophic changes noted.  Musculoskeletal: exam reveals no obvious joint deformities, tenderness or joint swelling or erythema.   Neurologically:  Mental status: The patient is awake, alert and oriented in all 4 spheres. His immediate and remote memory, attention, language skills and fund of knowledge are appropriate. There is no evidence of aphasia, agnosia, apraxia or anomia. Speech is clear with normal  prosody and enunciation. Thought process is linear. Mood is normal and affect is normal.  Cranial nerves II - XII are as described above under HEENT exam. In addition: shoulder shrug is normal with equal shoulder height noted. Motor exam: Normal bulk, strength and tone is noted. There is no drift, tremor or rebound. Romberg is negative. Reflexes are 1+ throughout. Fine motor skills and coordination: grossly intact.  Cerebellar testing: No dysmetria or intention tremor. There is no truncal or gait ataxia.  Sensory exam: intact to light touch.  Gait, station and balance: He stands easily. No veering to one side is noted. No leaning to one side is noted. Posture is age-appropriate and stance is narrow based. Gait shows normal stride length and normal pace. No problems turning are noted. Tandem walk is unremarkable.   Assessment and Plan:  In summary, John Whitney is a very pleasant 41 y.o.-year old male with an underlying medical history of hyperlipidemia, reflux disease, and obesity, whose history and physical exam are concerning for obstructive sleep apnea (OSA).  I  had a long chat with the patient about my findings and the diagnosis of OSA, its prognosis and treatment options. We talked about medical treatments, surgical interventions and non-pharmacological approaches. I explained in particular the risks and ramifications of untreated moderate to severe OSA, especially with respect to developing cardiovascular disease down the Road, including congestive heart failure, difficult to treat hypertension, cardiac arrhythmias, or stroke. Even type 2 diabetes has, in part, been linked to untreated OSA. Symptoms of untreated OSA include daytime sleepiness, memory problems, mood irritability and mood disorder such as depression and anxiety, lack of energy, as well as recurrent headaches, especially morning headaches. We talked about  smoking cessation and trying to maintain a healthy lifestyle in general, as  well as the importance of weight control. I encouraged the patient to eat healthy, exercise daily and keep well hydrated, to keep a scheduled bedtime and wake time routine, to not skip any meals and eat healthy snacks in between meals. I advised the patient not to drive when feeling sleepy. I recommended the following at this time: home sleep test.  I explained the sleep test procedure to the patient and also outlined possible surgical and non-surgical treatment options of OSA, including the use of a custom-made dental device (which would require a referral to a specialist dentist or oral surgeon), upper airway surgical options, such as pillar implants, radiofrequency surgery, tongue base surgery, and UPPP (which would involve a referral to an ENT surgeon). Rarely, jaw surgery such as mandibular advancement may be considered.  I also explained the CPAP treatment option to the patient, who indicated that he would be willing to try CPAP if the need arises. I explained the importance of being compliant with PAP treatment, not only for insurance purposes but primarily to improve His symptoms, and for the patient's long term health benefit, including to reduce His cardiovascular risks. I answered all his questions today and the patient was in agreement. I would like to see him back after the sleep study is completed and encouraged him to call with any interim questions, concerns, problems or updates.   Thank you very much for allowing me to participate in the care of this nice patient. If I can be of any further assistance to you please do not hesitate to call me at [phone].  Sincerely,   John Foley, MD, PhD

## 2017-04-08 NOTE — Patient Instructions (Addendum)

## 2017-05-03 ENCOUNTER — Ambulatory Visit (INDEPENDENT_AMBULATORY_CARE_PROVIDER_SITE_OTHER): Payer: Self-pay | Admitting: Neurology

## 2017-05-03 DIAGNOSIS — E669 Obesity, unspecified: Secondary | ICD-10-CM

## 2017-05-03 DIAGNOSIS — G479 Sleep disorder, unspecified: Secondary | ICD-10-CM

## 2017-05-03 DIAGNOSIS — R0681 Apnea, not elsewhere classified: Secondary | ICD-10-CM

## 2017-05-03 DIAGNOSIS — G4719 Other hypersomnia: Secondary | ICD-10-CM

## 2017-05-03 DIAGNOSIS — R0683 Snoring: Secondary | ICD-10-CM

## 2017-05-03 DIAGNOSIS — G4710 Hypersomnia, unspecified: Secondary | ICD-10-CM

## 2017-05-11 ENCOUNTER — Telehealth: Payer: Self-pay

## 2017-05-11 NOTE — Procedures (Signed)
  [facility] [address] [place], [place] [id number] NAME:   John Whitney                                                          DOB: 02-Feb-1976 MEDICAL RECORD NUMBER  [id number]                                          DOS: 05/03/2017 REFERRING PHYSICIAN: John Russo,MD STUDY PERFORMED: Home Sleep Test  HISTORY: 41 year old man with a history of hyperlipidemia, reflux disease, and obesity, who reports snoring and excessive daytime somnolence. His Epworth sleepiness score is 6 out of 24, BMI of 33.7.        STUDY RESULTS: Total Recording Time:  6 Hours, 37 Minutes, Total valid test time: 5 hours, 33 min Total Apnea/Hypopnea Index (AHI): 3.4/Hour, Snoring events: 490 Average Oxygen Saturation:   91% Lowest Oxygen Saturation: 84%  Total Time Oxygen Saturation Below or at 88%: 2 min (1%) Average Heart Rate:      69 bpm  IMPRESSION: Sleep disturbance, Snoring RECOMMENDATION: This study does not demonstrate any significant obstructive or central sleep disordered breathing. Snoring was noted. For disturbing snoring, an oral appliance (through a qualified dentist) can be considered.  This study does not support an intrinsic sleep disorder as a cause of the patient's symptoms. Other causes, including circadian rhythm disturbances, an underlying mood disorder, medication effect and/or an underlying medical problem cannot be ruled out. The patient should be cautioned not to drive, work at heights, or operate dangerous or heavy equipment when tired or sleepy. Review and reiteration of good sleep hygiene measures should be pursued with any patient. The patient and his referring provider will be notified of the test results.  I certify that I have reviewed the raw data recording prior to the issuance of this report in accordance with the standards of the American Academy of Sleep Medicine (AASM).  Huston FoleySaima Timica Marcom, MD, PhD [facility] Diplomat, ABPN  (Neurology and Sleep)

## 2017-05-11 NOTE — Telephone Encounter (Signed)
I called pt. I advised pt that Dr. Frances FurbishAthar reviewed pt's sleep study and found that pt did not demonstrate any significant obstructive of central sleep disordered breathing. Snoring was noted. Dr. Frances FurbishAthar recommends that pt speak with a qualified dentist for treatment of snoring if it is disturbing. I advised him that this study does not support an intrinsic sleep disorder as a cause of the patient's symptoms but other causes including circadian rhythm disturbances, and underlying mood disorder, medication effect and/or an underlying medical problem cannot be ruled out. Pt was advised to not drive, work at heights, or operate hazardous machinery when sleepy.   I reviewed sleep hygiene recommendations with the pt, including trying to keep a regular sleep wake schedule, avoiding electronics in the bedroom, keeping the bedroom cool, dark, and quiet, and avoiding eating or exercising within 2 hours of bedtime as well as eating in the middle of the night. I advised pt to keep pets away from the bedtime. I discussed with pt the importance of stress relief and to try meditation, deep breathing exercises, and/or a white noise machine or fan to diffuse other noise distracters. I advised pt to not drink alcohol before bedtime and to never mix alcohol and sedating medications. Pt was advised to avoid narcotic pain medication close to bedtime. I advised pt that a copy of these sleep study results will be sent to Dr. Timothy Lassousso. Pt asked that I mail him a copy of his sleep study and I verified with pt that the address we have on file is correct. I discussed with pt the option on an in-lab sleep study if he would like, since the in-labs are more accurate. Pt says that he will consider an in-lab sleep study and let us know if he wants one. Pt verbalized understanding of results. Pt had no questions at this time but was encouraged to call back if questions arise.

## 2017-05-11 NOTE — Telephone Encounter (Signed)
-----   Message from Huston FoleySaima Athar, MD sent at 05/11/2017  8:43 AM EST ----- Patient referred by Dr. Timothy Lassousso, seen by me on 04/08/17, HST on 05/03/17.   Please call and notify the patient that the recent home sleep test did not demonstrate any significant obstructive or central sleep disordered breathing. Snoring was noted. For disturbing snoring, an oral appliance (through a qualified dentist) can be considered.  This study does not support an intrinsic sleep disorder as a cause of the patient's symptoms. Other causes, including circadian rhythm disturbances, an underlying mood disorder, medication effect and/or an underlying medical problem cannot be ruled out. The patient should be cautioned not to drive, work at heights, or operate dangerous or heavy equipment when tired or sleepy. Please remind patient to try to maintain good sleep hygiene, which means: Keep a regular sleep and wake schedule and make enough time for sleep (7 1/2 to 8 1/2 hours for the average adult), try not to exercise or have a meal within 2 hours of your bedtime, try to keep your bedroom conducive for sleep, that is, cool and dark, without light distractors such as an illuminated alarm clock, and refrain from watching TV right before sleep or in the middle of the night and do not keep the TV or radio on during the night. If a nightlight is used, have it away from the visual field. Also, try not to use or play on electronic devices at bedtime, such as your cell phone, tablet PC or laptop. If you like to read at bedtime on an electronic device, try to dim the background light as much as possible. Do not eat in the middle of the night. Keep pets away from the bedroom environment. For stress relief, try meditation, deep breathing exercises (there are many books and CDs available), a white noise machine or fan can help to diffuse other noise distractors, such as traffic noise. Do not drink alcohol before bedtime, as it can disturb sleep and cause  middle of the night awakenings. Never mix alcohol and sedating medications! Avoid narcotic pain medication close to bedtime, as opioids/narcotics can suppress breathing drive and breathing effort.  If snoring and apneas are a significant concern, as in witnessed apneas, gasping etc, he is encouraged to consider an attended sleep study (we talked about it during the visit, but he is self pay and preferred the HST, which I understand). If he would like for me to order a sleep study in lab, which would be more accurate, we can do it.  Let me know.   Thanks,  Huston FoleySaima Athar, MD, PhD [facility]

## 2017-05-11 NOTE — Progress Notes (Signed)
Patient referred by Dr. Russo, seen by me on 04/08/17, HST on 05/03/17.   PTimothy Lassolease call and notify the patient that the recent home sleep test did not demonstrate any significant obstructive or central sleep disordered breathing. Snoring was noted. For disturbing snoring, an oral appliance (through a qualified dentist) can be considered.  This study does not support an intrinsic sleep disorder as a cause of the patient's symptoms. Other causes, including circadian rhythm disturbances, an underlying mood disorder, medication effect and/or an underlying medical problem cannot be ruled out. The patient should be cautioned not to drive, work at heights, or operate dangerous or heavy equipment when tired or sleepy. Please remind patient to try to maintain good sleep hygiene, which means: Keep a regular sleep and wake schedule and make enough time for sleep (7 1/2 to 8 1/2 hours for the average adult), try not to exercise or have a meal within 2 hours of your bedtime, try to keep your bedroom conducive for sleep, that is, cool and dark, without light distractors such as an illuminated alarm clock, and refrain from watching TV right before sleep or in the middle of the night and do not keep the TV or radio on during the night. If a nightlight is used, have it away from the visual field. Also, try not to use or play on electronic devices at bedtime, such as your cell phone, tablet PC or laptop. If you like to read at bedtime on an electronic device, try to dim the background light as much as possible. Do not eat in the middle of the night. Keep pets away from the bedroom environment. For stress relief, try meditation, deep breathing exercises (there are many books and CDs available), a white noise machine or fan can help to diffuse other noise distractors, such as traffic noise. Do not drink alcohol before bedtime, as it can disturb sleep and cause middle of the night awakenings. Never mix alcohol and sedating  medications! Avoid narcotic pain medication close to bedtime, as opioids/narcotics can suppress breathing drive and breathing effort.  If snoring and apneas are a significant concern, as in witnessed apneas, gasping etc, he is encouraged to consider an attended sleep study (we talked about it during the visit, but he is self pay and preferred the HST, which I understand). If he would like for me to order a sleep study in lab, which would be more accurate, we can do it.  Let me know.   Thanks,  Huston FoleySaima Gilberto Streck, MD, PhD [facility])

## 2017-08-09 ENCOUNTER — Other Ambulatory Visit: Payer: Self-pay

## 2020-01-11 ENCOUNTER — Other Ambulatory Visit: Payer: Self-pay | Admitting: Psychiatry

## 2020-01-11 ENCOUNTER — Other Ambulatory Visit: Payer: Self-pay | Admitting: Internal Medicine

## 2020-01-11 DIAGNOSIS — E785 Hyperlipidemia, unspecified: Secondary | ICD-10-CM

## 2020-01-29 ENCOUNTER — Ambulatory Visit
Admission: RE | Admit: 2020-01-29 | Discharge: 2020-01-29 | Disposition: A | Discharge status: Home / Self Care | Payer: No Typology Code available for payment source | Source: Ambulatory Visit | Attending: Internal Medicine | Admitting: Internal Medicine

## 2020-01-29 DIAGNOSIS — E785 Hyperlipidemia, unspecified: Secondary | ICD-10-CM

## 2021-04-18 IMAGING — CT CT CARDIAC CORONARY ARTERY CALCIUM SCORE
3 series · 14 of 20 positions shown, 16 images · non-contrast
Comparison: None.

CLINICAL DATA: Elevated cholesterol.

EXAM:
CT CARDIAC CORONARY ARTERY CALCIUM SCORE
TECHNIQUE: Non-contrast imaging through the heart was performed using
prospective ECG gating. Image post processing was performed on an
independent workstation, allowing for quantitative analysis of the
heart and coronary arteries. Note that this exam targets the heart
and the chest was not imaged in its entirety.

[Series 2: calcium scoring 2.00 qr36 bestdiast 68% hrt calciu · axial · 0.45mm/px · z∈[+1394,+1474]mm · 4 of 68 slices shown]
[im 14/68  vessel]
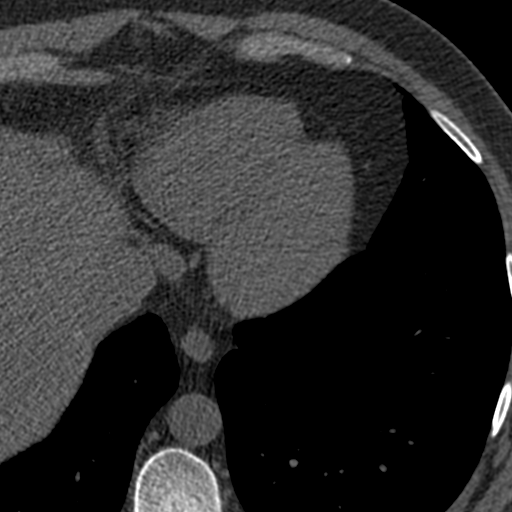
[im 27/68  vessel]
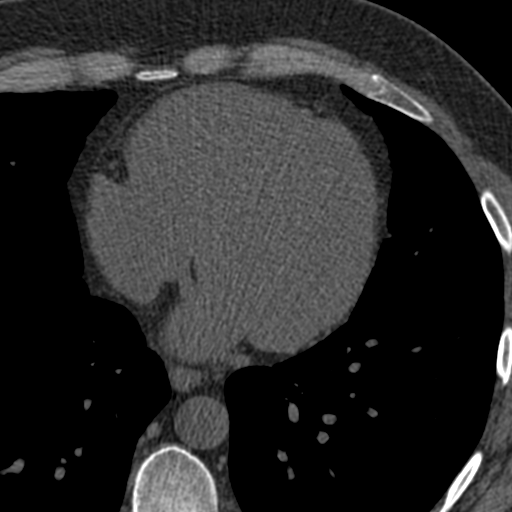
[im 41/68  vessel]
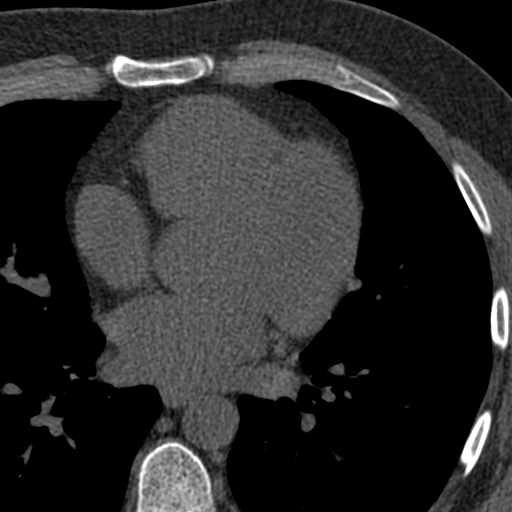
[im 54/68  vessel]
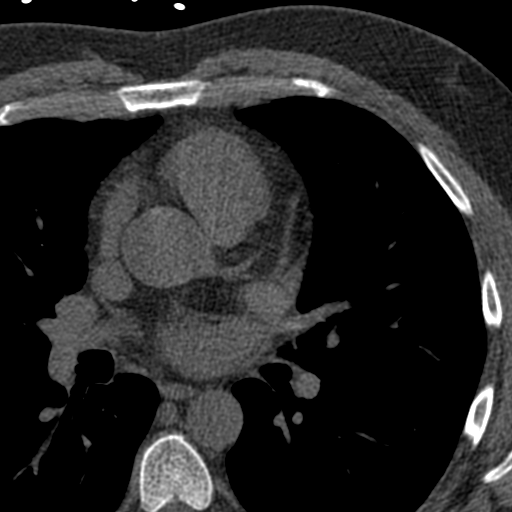

[Series 3: calcium scoring 2.00 br40 bestdiast 68% axial · axial · 0.63mm/px · z∈[+1380,+1476]mm · 5 of 74 slices shown, 7 images]
[im 13/74  vessel]
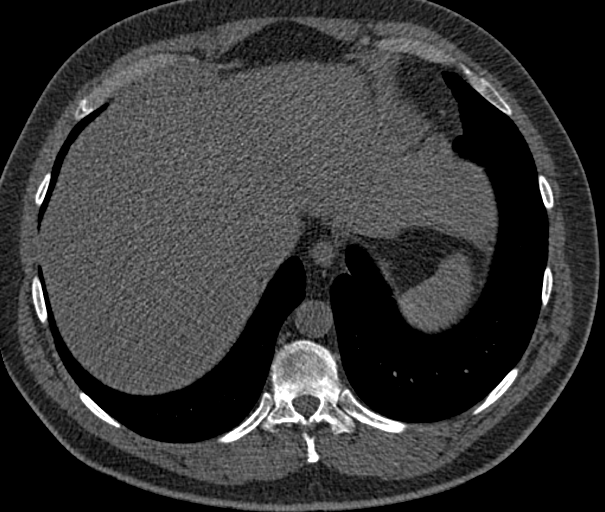
[im 13/74  lung]
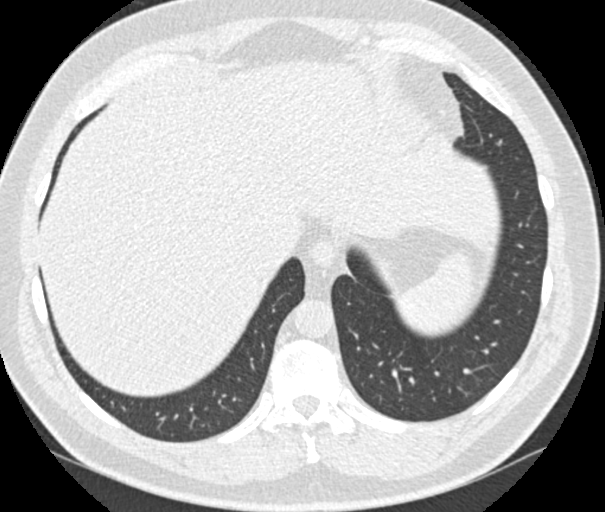
[im 25/74  vessel]
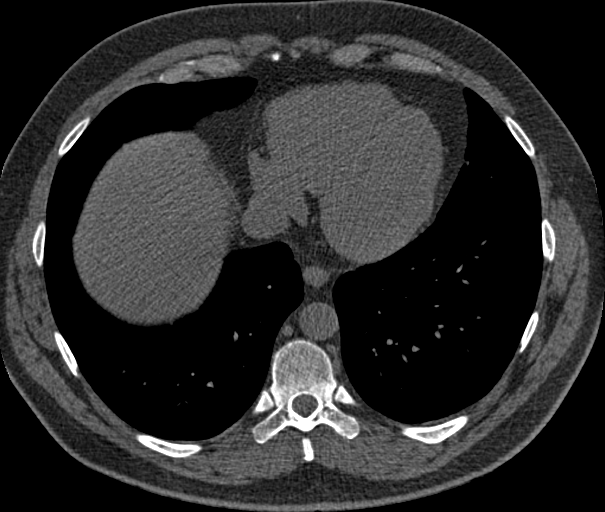
[im 37/74  vessel]
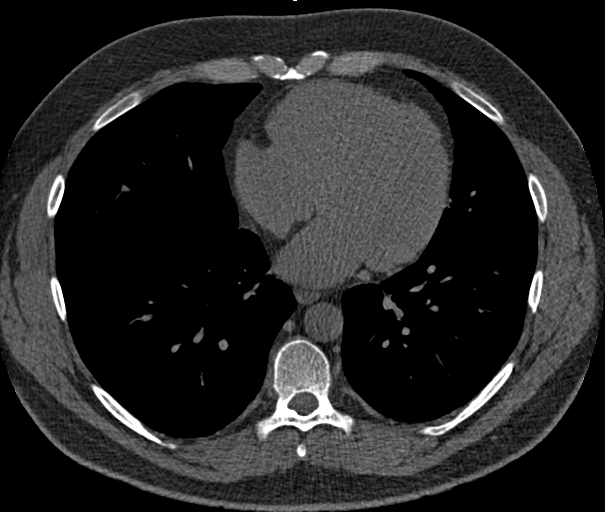
[im 49/74  vessel]
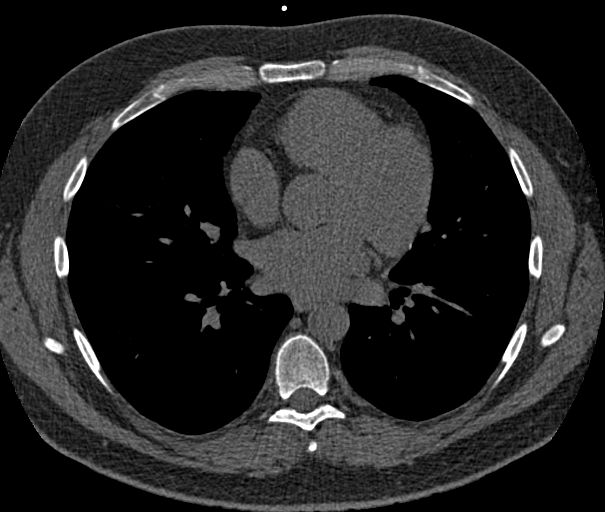
[im 61/74  vessel]
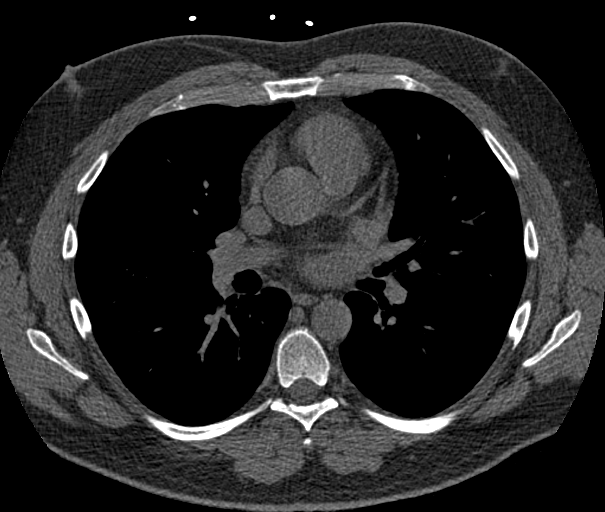
[im 61/74  lung]
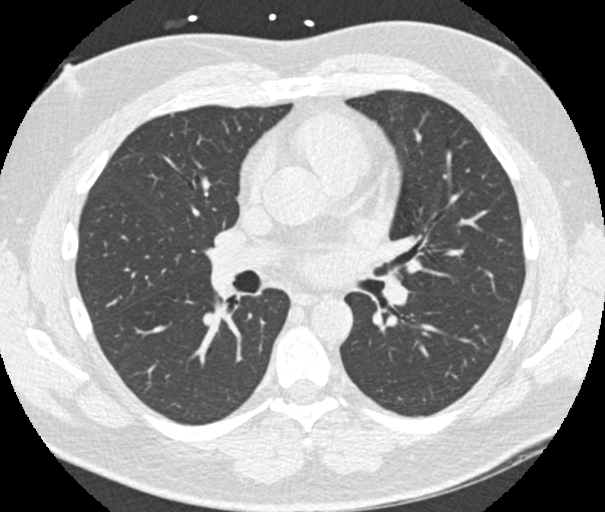

[Series 9: calcium scoring 2.00 br60 bestdiast 68% lungs · axial · 0.63mm/px · z∈[+1380,+1476]mm · 5 of 74 slices shown]
[im 13/74  vessel]
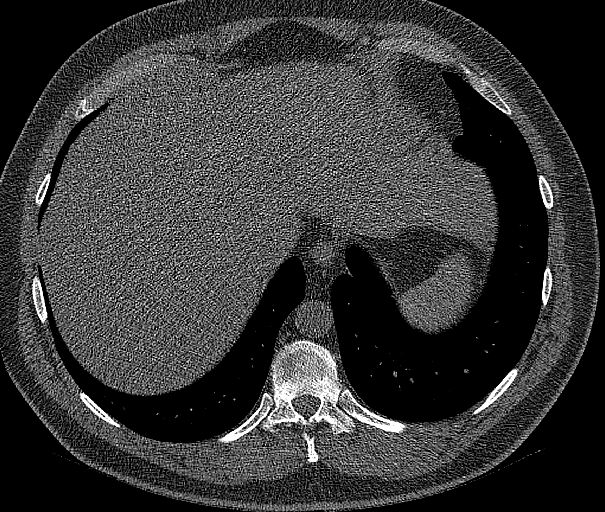
[im 25/74  vessel]
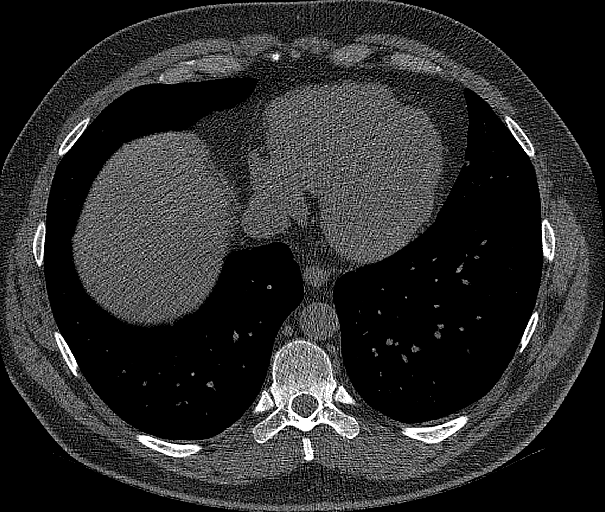
[im 37/74  vessel]
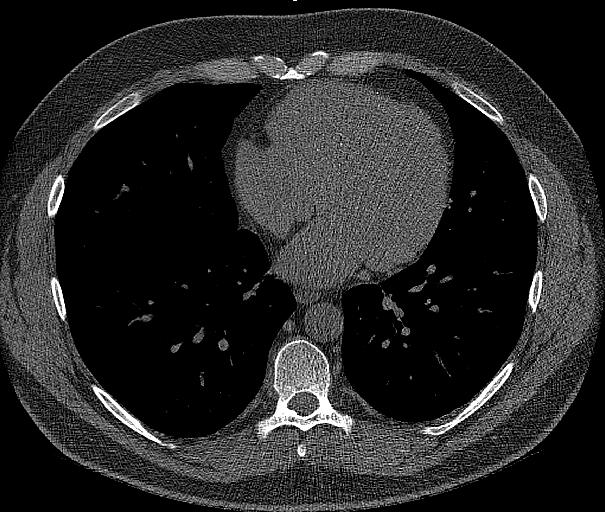
[im 49/74  vessel]
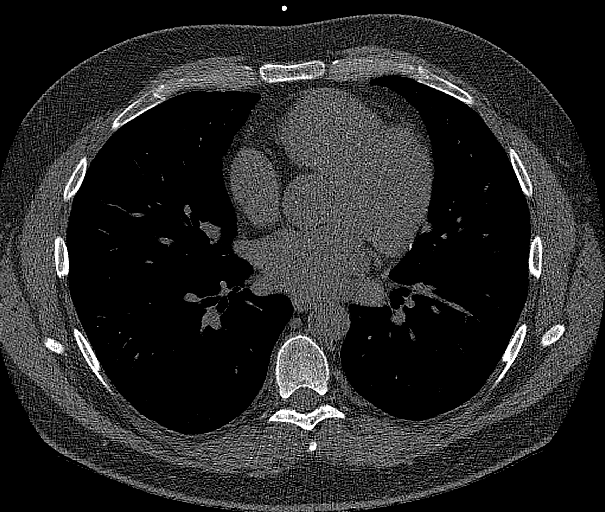
[im 61/74  vessel]
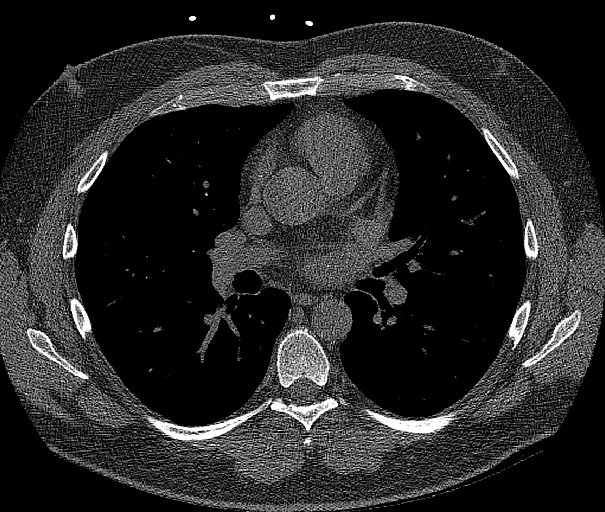

[14 of 20 positions shown; findings below may reference images not displayed]

FINDINGS: CORONARY CALCIUM SCORES:

Total Agatston Score: 0- No coronary calcium identified.

AORTA MEASUREMENTS:

Ascending Aorta: 32 mm

Descending Aorta: 26 mm

OTHER FINDINGS:

Cardiovascular: Normal aortic caliber. Normal heart size, without
pericardial effusion.

Mediastinum/Nodes: No imaged thoracic adenopathy.

Lungs/Pleura: No pleural fluid.  Clear imaged lungs.

Upper Abdomen: Normal imaged portions of the liver, spleen, stomach.

Musculoskeletal: No acute osseous abnormality.
IMPRESSION: 1. No coronary calcium identified.
2. No acute process in the imaged chest.

## 2022-09-28 ENCOUNTER — Encounter: Payer: Self-pay | Admitting: Gastroenterology

## 2022-10-07 ENCOUNTER — Ambulatory Visit (AMBULATORY_SURGERY_CENTER): Payer: Medicaid Other | Admitting: *Deleted

## 2022-10-07 VITALS — Ht 73.0 in | Wt 260.0 lb

## 2022-10-07 DIAGNOSIS — Z1211 Encounter for screening for malignant neoplasm of colon: Secondary | ICD-10-CM

## 2022-10-07 MED ORDER — NA SULFATE-K SULFATE-MG SULF 17.5-3.13-1.6 GM/177ML PO SOLN: 1.0000 | Freq: Once | ORAL | 0 refills | Status: AC

## 2022-10-07 NOTE — Progress Notes (Signed)
No egg or soy allergy known to patient  No issues known to pt with past sedation with any surgeries or procedures Patient denies ever being told they had issues or difficulty with intubation  No FH of Malignant Hyperthermia Pt is not on diet pills Pt is not on  home 02  Pt is not on blood thinners  Pt denies issues with constipation  No A fib or A flutter Have any cardiac testing pending--no Pt instructed to use Singlecare.com or GoodRx for a price reduction on prep     Mobility independently Patient's chart reviewed by Cathlyn Parsons CNRA prior to previsit and patient appropriate for the [facility].  Previsit completed and red dot placed by patient's name on their procedure day (on provider's schedule).

## 2022-10-28 ENCOUNTER — Telehealth: Payer: Self-pay | Admitting: Gastroenterology

## 2022-10-28 NOTE — Telephone Encounter (Signed)
Pt requesting miralax prep. New instruction sent to patients mychart. Pt understands to call the office back if he needs clarification on anything.

## 2022-10-28 NOTE — Telephone Encounter (Signed)
Patient called requesting a call back regarding questions about prep medication for his colonoscopy on 05/22. Please advise, thank you.

## 2022-10-28 NOTE — Telephone Encounter (Signed)
Attempted to reach pt for review of prep. LM with call back #.

## 2022-10-31 ENCOUNTER — Encounter: Payer: Self-pay | Admitting: Certified Registered Nurse Anesthetist

## 2022-11-04 ENCOUNTER — Ambulatory Visit (AMBULATORY_SURGERY_CENTER): Payer: Medicaid Other | Admitting: Gastroenterology

## 2022-11-04 ENCOUNTER — Encounter: Payer: Self-pay | Admitting: Gastroenterology

## 2022-11-04 VITALS — BP 105/62 | HR 73 | Temp 98.0°F | Resp 18 | Ht 73.0 in | Wt 260.0 lb

## 2022-11-04 DIAGNOSIS — Z1211 Encounter for screening for malignant neoplasm of colon: Secondary | ICD-10-CM

## 2022-11-04 DIAGNOSIS — D122 Benign neoplasm of ascending colon: Secondary | ICD-10-CM

## 2022-11-04 DIAGNOSIS — D125 Benign neoplasm of sigmoid colon: Secondary | ICD-10-CM

## 2022-11-04 MED ORDER — SODIUM CHLORIDE 0.9 % IV SOLN: 500.0000 mL | Freq: Once | INTRAVENOUS | Status: DC

## 2022-11-04 NOTE — Progress Notes (Signed)
Report given to PACU, vss 

## 2022-11-04 NOTE — Patient Instructions (Signed)
Handouts Provided:  Polyps  YOU HAD AN ENDOSCOPIC PROCEDURE TODAY AT THE [facility]:   Refer to the procedure report that was given to you for any specific questions about what was found during the examination.  If the procedure report does not answer your questions, please call your gastroenterologist to clarify.  If you requested that your care partner not be given the details of your procedure findings, then the procedure report has been included in a sealed envelope for you to review at your convenience later.  YOU SHOULD EXPECT: Some feelings of bloating in the abdomen. Passage of more gas than usual.  Walking can help get rid of the air that was put into your GI tract during the procedure and reduce the bloating. If you had a lower endoscopy (such as a colonoscopy or flexible sigmoidoscopy) you may notice spotting of blood in your stool or on the toilet paper. If you underwent a bowel prep for your procedure, you may not have a normal bowel movement for a few days.  Please Note:  You might notice some irritation and congestion in your nose or some drainage.  This is from the oxygen used during your procedure.  There is no need for concern and it should clear up in a day or so.  SYMPTOMS TO REPORT IMMEDIATELY:  Following lower endoscopy (colonoscopy or flexible sigmoidoscopy):  Excessive amounts of blood in the stool  Significant tenderness or worsening of abdominal pains  Swelling of the abdomen that is new, acute  Fever of 100F or higher  For urgent or emergent issues, a gastroenterologist can be reached at any hour by calling [phone]. Do not use MyChart messaging for urgent concerns.    DIET:  We do recommend a small meal at first, but then you may proceed to your regular diet.  Drink plenty of fluids but you should avoid alcoholic beverages for 24 hours.  ACTIVITY:  You should plan to take it easy for the rest of today and you should NOT DRIVE or use heavy  machinery until tomorrow (because of the sedation medicines used during the test).    FOLLOW UP: Our staff will call the number listed on your records the next business day following your procedure.  We will call around 7:15- 8:00 am to check on you and address any questions or concerns that you may have regarding the information given to you following your procedure. If we do not reach you, we will leave a message.     If any biopsies were taken you will be contacted by phone or by letter within the next 1-3 weeks.  Please call us at [phone] if you have not heard about the biopsies in 3 weeks.    SIGNATURES/CONFIDENTIALITY: You and/or your care partner have signed paperwork which will be entered into your electronic medical record.  These signatures attest to the fact that that the information above on your After Visit Summary has been reviewed and is understood.  Full responsibility of the confidentiality of this discharge information lies with you and/or your care-partner.  

## 2022-11-04 NOTE — Progress Notes (Signed)
Called to room to assist during endoscopic procedure.  Patient ID and intended procedure confirmed with present staff. Received instructions for my participation in the procedure from the performing physician.  

## 2022-11-04 NOTE — Op Note (Signed)
[facility] Patient Name: John Whitney Procedure Date: 11/04/2022 1:30 PM MRN: [id number] Endoscopist: Meryl Dare , MD, [phone] Age: 47 Referring MD:  Date of Birth: [birth year] Gender: Male Account #: [id number] Procedure:                Colonoscopy Indications:              Screening for colorectal malignant neoplasm Medicines:                Monitored Anesthesia Care Procedure:                Pre-Anesthesia Assessment:                           - Prior to the procedure, a History and Physical                            was performed, and patient medications and                            allergies were reviewed. The patient's tolerance of                            previous anesthesia was also reviewed. The risks                            and benefits of the procedure and the sedation                            options and risks were discussed with the patient.                            All questions were answered, and informed consent                            was obtained. Prior Anticoagulants: The patient has                            taken no anticoagulant or antiplatelet agents. ASA                            Grade Assessment: II - A patient with mild systemic                            disease. After reviewing the risks and benefits,                            the patient was deemed in satisfactory condition to                            undergo the procedure.                           After obtaining informed consent, the colonoscope  was passed under direct vision. Throughout the                            procedure, the patient's blood pressure, pulse, and                            oxygen saturations were monitored continuously. The                            CF [id number] #[id number] was introduced through the anus                            and advanced to the the cecum, identified by                            appendiceal orifice  and ileocecal valve. The                            ileocecal valve, appendiceal orifice, and rectum                            were photographed. The quality of the bowel                            preparation was good. The colonoscopy was performed                            without difficulty. The patient tolerated the                            procedure well. Scope In: 1:39:40 PM Scope Out: 1:55:48 PM Scope Withdrawal Time: 0 hours 14 minutes 16 seconds  Total Procedure Duration: 0 hours 16 minutes 8 seconds  Findings:                 The perianal and digital rectal examinations were                            normal.                           An 8 mm polyp was found in the sigmoid colon. The                            polyp was sessile. The polyp was removed with a                            cold snare. Resection and retrieval were complete.                           A 3 mm polyp was found in the ascending colon. The                            polyp was sessile. The polyp was removed with  a                            cold biopsy forceps. Resection and retrieval were                            complete.                           Internal hemorrhoids were found during                            retroflexion. The hemorrhoids were small and Grade                            I (internal hemorrhoids that do not prolapse).                           The exam was otherwise without abnormality on                            direct and retroflexion views. Complications:            No immediate complications. Estimated blood loss:                            None. Estimated Blood Loss:     Estimated blood loss: none. Impression:               - One 8 mm polyp in the sigmoid colon, removed with                            a cold snare. Resected and retrieved.                           - One 3 mm polyp in the ascending colon, removed                            with a cold biopsy forceps. Resected  and retrieved.                           - Internal hemorrhoids.                           - The examination was otherwise normal on direct                            and retroflexion views. Recommendation:           - Repeat colonoscopy date to be determined after                            pending pathology results are reviewed for                            surveillance based on pathology results.                           -  Patient has a contact number available for                            emergencies. The signs and symptoms of potential                            delayed complications were discussed with the                            patient. Return to normal activities tomorrow.                            Written discharge instructions were provided to the                            patient.                           - Resume previous diet.                           - Continue present medications.                           - Await pathology results. Meryl Dare, MD 11/04/2022 1:58:23 PM This report has been signed electronically.

## 2022-11-04 NOTE — Progress Notes (Signed)
History & Physical  Primary Care Physician:  Creola Corn, MD Primary Gastroenterologist: Claudette Head, MD  Impression / Plan:  Average risk CRC screening for colonoscopy  CHIEF COMPLAINT:  CRC screening   HPI: John Whitney is a 47 y.o. male average risk CRC screening for colonoscopy.    Past Medical History:  Diagnosis Date   GERD (gastroesophageal reflux disease)    Hiatal hernia    Hypercholesteremia    Stricture esophagus     Past Surgical History:  Procedure Laterality Date   TONSILLECTOMY     UPPER GASTROINTESTINAL ENDOSCOPY     VASECTOMY     WISDOM TOOTH EXTRACTION      Prior to Admission medications   Medication Sig Start Date End Date Taking? Authorizing Provider  atorvastatin (LIPITOR) 20 MG tablet TAKE ONE TABLET BY MOUTH DAILY Orally Once a day for 90 days   Yes [provider]  omeprazole (PRILOSEC) 40 MG capsule Take 1 capsule (40 mg total) by mouth 2 (two) times daily. Patient taking differently: Take 20 mg by mouth daily. 11/01/12  Yes Mardella Layman, MD  aspirin 81 MG tablet Take 81 mg by mouth daily.    [provider]    Current Outpatient Medications  Medication Sig Dispense Refill   atorvastatin (LIPITOR) 20 MG tablet TAKE ONE TABLET BY MOUTH DAILY Orally Once a day for 90 days     omeprazole (PRILOSEC) 40 MG capsule Take 1 capsule (40 mg total) by mouth 2 (two) times daily. (Patient taking differently: Take 20 mg by mouth daily.) 60 capsule 11   aspirin 81 MG tablet Take 81 mg by mouth daily.     Current Facility-Administered Medications  Medication Dose Route Frequency Provider Last Rate Last Admin   0.9 %  sodium chloride infusion  500 mL Intravenous Once Meryl Dare, MD        Allergies as of 11/04/2022   (No Known Allergies)    Family History  Problem Relation Age of Onset   Stroke Mother    High Cholesterol Father    Emphysema Maternal Grandfather    Heart attack Paternal Grandfather    Colon  cancer Neg Hx    Colon polyps Neg Hx    Crohn's disease Neg Hx    Esophageal cancer Neg Hx    Rectal cancer Neg Hx    Stomach cancer Neg Hx    Ulcerative colitis Neg Hx     Social History   Socioeconomic History   Marital status: Married    Spouse name: Not on file   Number of children: Not on file   Years of education: Not on file   Highest education level: Not on file  Occupational History   Not on file  Tobacco Use   Smoking status: Never   Smokeless tobacco: Never  Vaping Use   Vaping Use: Never used  Substance and Sexual Activity   Alcohol use: Yes    Comment: occasonally   Drug use: No   Sexual activity: Not on file  Other Topics Concern   Not on file  Social History Narrative   Not on file   Social Determinants of Health   Financial Resource Strain: Not on file  Food Insecurity: Not on file  Transportation Needs: Not on file  Physical Activity: Not on file  Stress: Not on file  Social Connections: Not on file  Intimate Partner Violence: Not on file    Review of Systems:  All systems  reviewed were negative except where noted in HPI.   Physical Exam:  General:  Alert, well-developed, in NAD Head:  Normocephalic and atraumatic. Eyes:  Sclera clear, no icterus.   Conjunctiva pink. Ears:  Normal auditory acuity. Mouth:  No deformity or lesions.  Neck:  Supple; no masses. Lungs:  Clear throughout to auscultation.   No wheezes, crackles, or rhonchi.  Heart:  Regular rate and rhythm; no murmurs. Abdomen:  Soft, nondistended, nontender. No masses, hepatomegaly. No palpable masses.  Normal bowel sounds.    Rectal:  Deferred   Msk:  Symmetrical without gross deformities. Extremities:  Without edema. Neurologic:  Alert and  oriented x 4; grossly normal neurologically. Skin:  Intact without significant lesions or rashes. Psych:  Alert and cooperative. Normal mood and affect.   Venita Lick. Russella Dar  11/04/2022, 1:33 PM See Loretha Stapler, [facility], to contact our on  call provider

## 2022-11-04 NOTE — Progress Notes (Signed)
Pt's states no medical or surgical changes since previsit or office visit. 

## 2022-11-05 ENCOUNTER — Telehealth: Payer: Self-pay

## 2022-11-05 NOTE — Telephone Encounter (Signed)
  Follow up Call-     11/04/2022    1:03 PM  Call back number  Post procedure Call Back phone  # [phone]  Permission to leave phone message Yes     Left message

## 2022-11-12 ENCOUNTER — Encounter (HOSPITAL_BASED_OUTPATIENT_CLINIC_OR_DEPARTMENT_OTHER): Payer: Self-pay | Admitting: Emergency Medicine

## 2022-11-12 ENCOUNTER — Encounter: Payer: Self-pay | Admitting: Gastroenterology

## 2022-11-12 ENCOUNTER — Emergency Department (HOSPITAL_BASED_OUTPATIENT_CLINIC_OR_DEPARTMENT_OTHER): Payer: Medicaid Other | Admitting: Radiology

## 2022-11-12 ENCOUNTER — Emergency Department (HOSPITAL_BASED_OUTPATIENT_CLINIC_OR_DEPARTMENT_OTHER)
Admission: EM | Admit: 2022-11-12 | Discharge: 2022-11-12 | Disposition: A | Discharge status: Home / Self Care | Payer: Medicaid Other | Attending: Emergency Medicine | Admitting: Emergency Medicine

## 2022-11-12 ENCOUNTER — Other Ambulatory Visit: Payer: Self-pay

## 2022-11-12 DIAGNOSIS — M722 Plantar fascial fibromatosis: Secondary | ICD-10-CM | POA: Insufficient documentation

## 2022-11-12 DIAGNOSIS — Z7982 Long term (current) use of aspirin: Secondary | ICD-10-CM | POA: Diagnosis not present

## 2022-11-12 DIAGNOSIS — M79671 Pain in right foot: Secondary | ICD-10-CM

## 2022-11-12 NOTE — Discharge Instructions (Addendum)
Your xray is okay. Please follow-up with sports medicine colleagues. Look into Superfeet inserts. Use Tylenol every 4 hours and ibuprofen every 6 hours as needed for pain if ice does not control pain.  Rest and activity modification - We advise all patients to rest when possible and minimize activities that repetitively place pressure on the plantar fascia (figure 1). Patients should avoid aggravating factors such as running, dancing, jumping, and walking barefoot as much as possible. Many patients have adequate symptom relief with rest and activity modification.  Supportive footwear - Footwear should provide sufficient cushioning to reduce pressure on the heel. We suggest a prefabricated silicone heel insert rather than other shoe inserts (Grade 2C). These can improve pain, and data do not clearly demonstrate that other, more expensive inserts, such as custom orthotics, offer more substantial benefit.  Address underlying conditions - We also address other conditions (eg, obesity, symptomatic flat feet) that may predispose to or exacerbate plantar fasciitis.

## 2022-11-12 NOTE — ED Provider Notes (Signed)
EMERGENCY DEPARTMENT AT [facility] Provider Note   CSN: [id number] Arrival date & time: 11/12/22  4098     History  Chief Complaint  Patient presents with   Foot Pain    John Whitney is a 47 y.o. male.  Patient presents with worsening right foot pain for almost 1 week gradually worsening.  Woke him from sleep this morning around 5:00.  Pain worse with putting pressure in certain positions.  Patient has no history of blood clots, no recent surgery, no cancer treatment, no leg swelling.  No direct foot trauma he recalls.  No history of similar.  Pain worse in the mid aspect and bottom of the foot.  No cold foot.       Home Medications Prior to Admission medications   Medication Sig Start Date End Date Taking? Authorizing Provider  aspirin 81 MG tablet Take 81 mg by mouth daily.    [provider]  atorvastatin (LIPITOR) 20 MG tablet TAKE ONE TABLET BY MOUTH DAILY Orally Once a day for 90 days    [provider]  omeprazole (PRILOSEC) 40 MG capsule Take 1 capsule (40 mg total) by mouth 2 (two) times daily. Patient taking differently: Take 20 mg by mouth daily. 11/01/12   Mardella Layman, MD      Allergies    Patient has no known allergies.    Review of Systems   Review of Systems  Constitutional:  Negative for chills and fever.  HENT:  Negative for congestion.   Eyes:  Negative for visual disturbance.  Respiratory:  Negative for shortness of breath.   Cardiovascular:  Negative for chest pain.  Gastrointestinal:  Negative for abdominal pain and vomiting.  Genitourinary:  Negative for dysuria and flank pain.  Musculoskeletal:  Positive for gait problem. Negative for back pain, neck pain and neck stiffness.  Skin:  Negative for rash.  Neurological:  Negative for light-headedness and headaches.    Physical Exam Updated Vital Signs BP (!) 133/94   Pulse 68   Temp 98.2 F (36.8 C)   Resp 18   Ht 6\' 1"  (1.854 m)   Wt 117.9  kg   SpO2 97%   BMI 34.29 kg/m  Physical Exam Vitals and nursing note reviewed.  Constitutional:      General: He is not in acute distress.    Appearance: He is well-developed.  HENT:     Head: Normocephalic and atraumatic.     Mouth/Throat:     Mouth: Mucous membranes are moist.  Eyes:     General:        Right eye: No discharge.        Left eye: No discharge.     Conjunctiva/sclera: Conjunctivae normal.  Neck:     Trachea: No tracheal deviation.  Cardiovascular:     Rate and Rhythm: Normal rate.  Pulmonary:     Effort: Pulmonary effort is normal.  Abdominal:     General: There is no distension.  Musculoskeletal:        General: Tenderness present.     Cervical back: Normal range of motion and neck supple. No rigidity.     Comments: Patient has tenderness to palpation mid plantar surface mid aspect and just distal to calcaneus.  Minimal tenderness dorsal midfoot.  No ankle tenderness.  No joint effusion.  Neurovascular intact.  Warm foot.  No signs of infection.  Skin:    General: Skin is warm.     Capillary Refill:  Capillary refill takes less than 2 seconds.     Findings: No rash.  Neurological:     General: No focal deficit present.     Mental Status: He is alert.  Psychiatric:        Mood and Affect: Mood normal.     ED Results / Procedures / Treatments   Labs (all labs ordered are listed, but only abnormal results are displayed) Labs Reviewed - No data to display  EKG None  Radiology DG Foot Complete Right  Result Date: 11/12/2022 CLINICAL DATA:  Pain EXAM: RIGHT FOOT COMPLETE - 3+ VIEW COMPARISON:  None Available. FINDINGS: No fracture or dislocation is seen. There are no focal lytic lesions. There is soft tissue swelling over the dorsum. IMPRESSION: No radiographic abnormalities are seen in right foot. Electronically Signed   By: Ernie Avena M.D.   On: 11/12/2022 08:00    Procedures Procedures    Medications Ordered in ED Medications - No  data to display  ED Course/ Medical Decision Making/ A&P                             Medical Decision Making Amount and/or Complexity of Data Reviewed Radiology: ordered.   Patient presents with worsening right foot pain worse on the plantar aspect and with walking.  Discussed likely plantar fasciitis given no trauma, discussed occult fracture/stress fracture.  No risk or signs of blood clots. X-ray ordered independently reviewed no obvious fracture or subluxation. Patient has crutches already and discussed follow-up with sports medicine/primary doctor.  Discussed exercises work note given.        Final Clinical Impression(s) / ED Diagnoses Final diagnoses:  Acute foot pain, right  Plantar fasciitis    Rx / DC Orders ED Discharge Orders     None         Blane Ohara, MD 11/12/22 [phone]

## 2022-11-12 NOTE — ED Triage Notes (Signed)
Pt c/o right foot/ankle pain x 1 week.

## 2022-12-01 ENCOUNTER — Ambulatory Visit: Payer: Medicaid Other | Admitting: Sports Medicine

## 2022-12-01 VITALS — BP 146/90 | Ht 73.0 in | Wt 260.0 lb

## 2022-12-01 DIAGNOSIS — M79671 Pain in right foot: Secondary | ICD-10-CM

## 2022-12-01 NOTE — Assessment & Plan Note (Signed)
We will give him plantar fascia stretches and exercises as much as a preventative as treatment plan Sports insoles with heel lifts and medium scaphoid pads were given today Arch strap  Recheck pending his response.  If he did respond well enough he could be a candidate for custom orthotics and or ESWT.

## 2022-12-01 NOTE — Progress Notes (Signed)
PCP: Creola Corn, MD  Subjective:   HPI: Patient is a 47 y.o. male here for right foot pain.  Dull aching pain in the plantar surface of the right mid foot with standing first thing in the morning 2-3 weeks ago. Evaluated in the ED for plantar fasciitis.  Feels pain started after increasing activity with lawn mowing. Has been using heel inserts since then with some resolution of the pain. Generally wears crocs while outside as he is often near a pool.  Past Medical History:  Diagnosis Date   GERD (gastroesophageal reflux disease)    Hiatal hernia    Hypercholesteremia    Stricture esophagus     Current Outpatient Medications on File Prior to Visit  Medication Sig Dispense Refill   aspirin 81 MG tablet Take 81 mg by mouth daily.     atorvastatin (LIPITOR) 20 MG tablet TAKE ONE TABLET BY MOUTH DAILY Orally Once a day for 90 days     omeprazole (PRILOSEC) 40 MG capsule Take 1 capsule (40 mg total) by mouth 2 (two) times daily. (Patient taking differently: Take 20 mg by mouth daily.) 60 capsule 11   No current facility-administered medications on file prior to visit.    Past Surgical History:  Procedure Laterality Date   TONSILLECTOMY     UPPER GASTROINTESTINAL ENDOSCOPY     VASECTOMY     WISDOM TOOTH EXTRACTION      No Known Allergies  BP (!) 146/90   Ht 6\' 1"  (1.854 m)   Wt 260 lb (117.9 kg)   BMI 34.30 kg/m       No data to display              No data to display              Objective:  Physical Exam:  Gen: NAD, comfortable in exam room   Right foot Plantar fascia measuring 0.4 on the R and 0.46 on the left with heel spur on left calcaneus on [place]  Widening of the right forefoot compared to left with loss of transverse arch  TTP of the right mid foot, minimal TTP of the R heel at medial plantar fascia insertion or over the calcaneus    Assessment & Plan:  1. Longitudinal arch strain given more tenderness of the plantar mid foot. Given shoe inserts  with more arch padding and heel lifts. Encourage wearing these with regular shoes and running shoes. Arch strap for more arch support as well. Home exercises for stretching plantar fascia and foot strengthening.  Follow up in 4 - 6 weeks.   I observed and examined the patient with the resident and agree with assessment and plan.  Note reviewed and modified by me. I did suggest he consider a highly cushion shoe like a Hoka.  With his increased weight this may give him additional support for when he has to stand or walk longer distances. Sterling Big, MD

## 2023-11-11 ENCOUNTER — Ambulatory Visit: Admitting: Orthopedic Surgery

## 2023-11-11 ENCOUNTER — Encounter: Payer: Self-pay | Admitting: Orthopedic Surgery

## 2023-11-11 DIAGNOSIS — M6701 Short Achilles tendon (acquired), right ankle: Secondary | ICD-10-CM | POA: Diagnosis not present

## 2023-11-11 DIAGNOSIS — M79671 Pain in right foot: Secondary | ICD-10-CM

## 2023-11-11 NOTE — Progress Notes (Signed)
 Office Visit Note   Patient: John Whitney           Date of Birth: [birth year]           MRN: [id number] Visit Date: 11/11/2023              Requested by: Margarete Sharps, MD [address] [place],  [place] [address] PCP: Margarete Sharps, MD  Chief Complaint  Patient presents with   Right Foot - Pain      HPI: Patient is a 48 year old gentleman is seen for initial evaluation for right foot pain.  Patient states he had an overuse injury about a year ago.  Patient went to the emergency department a year ago radiographs were normal.  Patient states he has been told he has plantar fasciitis.  He states he has no pain since that time.  Assessment & Plan: Visit Diagnoses:  1. Pain in right foot     Plan: Patient does have Achilles contracture patient was given instructions and demonstrated Achilles stretching.  Recommended a stiff soled sneaker to unload the plantar fascia  Follow-Up Instructions: Return if symptoms worsen or fail to improve.   Ortho Exam  Patient is alert, oriented, no adenopathy, well-dressed, normal affect, normal respiratory effort. Examination patient has a palpable dorsalis pedis pulse.  With his knee extended he has dorsiflexion 20 degrees short of neutral of the right ankle.  He has good ankle and subtalar motion.  There are no plantar ulcers or calluses.  The Achilles is not tender to palpation the plantar fascia is not tender to palpation.  Imaging: No results found. No images are attached to the encounter.  Labs: No results found for: "HGBA1C", "ESRSEDRATE", "CRP", "LABURIC", "REPTSTATUS", "GRAMSTAIN", "CULT", "LABORGA"   Lab Results  Component Value Date   ALBUMIN 4.1 11/01/2012   ALBUMIN 4.1 11/01/2012   ALBUMIN 4.4 11/12/2009    No results found for: "MG" No results found for: "VD25OH"  No results found for: "PREALBUMIN"    Latest Ref Rng & Units 11/01/2012    4:49 PM 11/12/2009   10:59 AM  CBC EXTENDED  WBC 4.5 - 10.5 K/uL 8.2  6.6    RBC 4.22 - 5.81 Mil/uL 4.94  4.72   Hemoglobin 13.0 - 17.0 g/dL 19.1  47.8   HCT 29.5 - 52.0 % 44.3  43.1   Platelets 150.0 - 400.0 K/uL 224.0  235.0   NEUT# 1.4 - 7.7 K/uL 4.2  3.4   Lymph# 0.7 - 4.0 K/uL 3.0  2.4      There is no height or weight on file to calculate BMI.  Orders:  No orders of the defined types were placed in this encounter.  No orders of the defined types were placed in this encounter.    Procedures: No procedures performed  Clinical Data: No additional findings.  ROS:  All other systems negative, except as noted in the HPI. Review of Systems  Objective: Vital Signs: There were no vitals taken for this visit.  Specialty Comments:  No specialty comments available.  PMFS History: Patient Active Problem List   Diagnosis Date Noted   Arch pain of right foot 12/01/2022   Mixed hyperlipidemia 10/23/2013   ESOPHAGEAL REFLUX 11/12/2009   Past Medical History:  Diagnosis Date   GERD (gastroesophageal reflux disease)    Hiatal hernia    Hypercholesteremia    Stricture esophagus     Family History  Problem Relation Age of Onset   Stroke Mother  High Cholesterol Father    Emphysema Maternal Grandfather    Heart attack Paternal Grandfather    Colon cancer Neg Hx    Colon polyps Neg Hx    Crohn's disease Neg Hx    Esophageal cancer Neg Hx    Rectal cancer Neg Hx    Stomach cancer Neg Hx    Ulcerative colitis Neg Hx     Past Surgical History:  Procedure Laterality Date   TONSILLECTOMY     UPPER GASTROINTESTINAL ENDOSCOPY     VASECTOMY     WISDOM TOOTH EXTRACTION     Social History   Occupational History   Not on file  Tobacco Use   Smoking status: Never   Smokeless tobacco: Never  Vaping Use   Vaping status: Never Used  Substance and Sexual Activity   Alcohol use: Yes    Comment: occasonally   Drug use: No   Sexual activity: Not on file

## 2024-04-17 ENCOUNTER — Encounter: Payer: Self-pay | Admitting: Radiology
